# Patient Record
Sex: Female | Born: 1965 | Race: White | Hispanic: No | Smoking: Never smoker
Health system: Southern US, Community
[De-identification: ages and names within clinical notes are randomized; demographics above are authoritative.]

## PROBLEM LIST (undated history)

## (undated) DIAGNOSIS — F329 Major depressive disorder, single episode, unspecified: Secondary | ICD-10-CM

## (undated) DIAGNOSIS — F419 Anxiety disorder, unspecified: Secondary | ICD-10-CM

## (undated) DIAGNOSIS — F32A Depression, unspecified: Secondary | ICD-10-CM

## (undated) DIAGNOSIS — M1990 Unspecified osteoarthritis, unspecified site: Secondary | ICD-10-CM

## (undated) HISTORY — DX: Major depressive disorder, single episode, unspecified: F32.9

## (undated) HISTORY — PX: BREAST BIOPSY: SHX20

## (undated) HISTORY — PX: CHOLECYSTECTOMY: SHX55

## (undated) HISTORY — PX: OTHER SURGICAL HISTORY: SHX169

## (undated) HISTORY — PX: BREAST EXCISIONAL BIOPSY: SUR124

## (undated) HISTORY — DX: Depression, unspecified: F32.A

## (undated) HISTORY — DX: Unspecified osteoarthritis, unspecified site: M19.90

## (undated) HISTORY — DX: Anxiety disorder, unspecified: F41.9

---

## 1999-12-17 ENCOUNTER — Other Ambulatory Visit: Admission: RE | Admit: 1999-12-17 | Discharge: 1999-12-17 | Payer: Self-pay | Admitting: Obstetrics and Gynecology

## 2000-06-05 ENCOUNTER — Observation Stay (HOSPITAL_COMMUNITY): Admission: RE | Admit: 2000-06-05 | Discharge: 2000-06-06 | Payer: Self-pay | Admitting: General Surgery

## 2000-06-05 ENCOUNTER — Encounter (INDEPENDENT_AMBULATORY_CARE_PROVIDER_SITE_OTHER): Payer: Self-pay | Admitting: Specialist

## 2001-09-20 ENCOUNTER — Other Ambulatory Visit: Admission: RE | Admit: 2001-09-20 | Discharge: 2001-09-20 | Payer: Self-pay | Admitting: Obstetrics and Gynecology

## 2002-02-11 ENCOUNTER — Inpatient Hospital Stay (HOSPITAL_COMMUNITY): Admission: AD | Admit: 2002-02-11 | Discharge: 2002-02-11 | Payer: Self-pay | Admitting: Obstetrics and Gynecology

## 2002-02-12 ENCOUNTER — Observation Stay (HOSPITAL_COMMUNITY): Admission: AD | Admit: 2002-02-12 | Discharge: 2002-02-13 | Payer: Self-pay | Admitting: Obstetrics and Gynecology

## 2002-03-20 ENCOUNTER — Inpatient Hospital Stay (HOSPITAL_COMMUNITY): Admission: AD | Admit: 2002-03-20 | Discharge: 2002-03-20 | Payer: Self-pay | Admitting: *Deleted

## 2002-03-30 ENCOUNTER — Inpatient Hospital Stay (HOSPITAL_COMMUNITY): Admission: AD | Admit: 2002-03-30 | Discharge: 2002-03-30 | Payer: Self-pay | Admitting: Obstetrics and Gynecology

## 2002-04-02 ENCOUNTER — Inpatient Hospital Stay (HOSPITAL_COMMUNITY): Admission: AD | Admit: 2002-04-02 | Discharge: 2002-04-06 | Payer: Self-pay | Admitting: Obstetrics and Gynecology

## 2002-04-07 ENCOUNTER — Encounter: Admission: RE | Admit: 2002-04-07 | Discharge: 2002-05-07 | Payer: Self-pay | Admitting: Obstetrics and Gynecology

## 2002-05-11 ENCOUNTER — Other Ambulatory Visit: Admission: RE | Admit: 2002-05-11 | Discharge: 2002-05-11 | Payer: Self-pay | Admitting: Obstetrics and Gynecology

## 2003-05-22 ENCOUNTER — Other Ambulatory Visit: Admission: RE | Admit: 2003-05-22 | Discharge: 2003-05-22 | Payer: Self-pay | Admitting: Obstetrics and Gynecology

## 2004-05-22 ENCOUNTER — Other Ambulatory Visit: Admission: RE | Admit: 2004-05-22 | Discharge: 2004-05-22 | Payer: Self-pay | Admitting: Obstetrics and Gynecology

## 2004-08-01 ENCOUNTER — Inpatient Hospital Stay (HOSPITAL_COMMUNITY): Admission: AD | Admit: 2004-08-01 | Discharge: 2004-08-01 | Payer: Self-pay | Admitting: Obstetrics and Gynecology

## 2004-11-24 ENCOUNTER — Inpatient Hospital Stay (HOSPITAL_COMMUNITY): Admission: AD | Admit: 2004-11-24 | Discharge: 2004-11-24 | Payer: Self-pay | Admitting: Obstetrics and Gynecology

## 2004-12-03 ENCOUNTER — Inpatient Hospital Stay (HOSPITAL_COMMUNITY): Admission: RE | Admit: 2004-12-03 | Discharge: 2004-12-06 | Payer: Self-pay | Admitting: Obstetrics and Gynecology

## 2005-01-15 ENCOUNTER — Other Ambulatory Visit: Admission: RE | Admit: 2005-01-15 | Discharge: 2005-01-15 | Payer: Self-pay | Admitting: Obstetrics and Gynecology

## 2006-06-09 ENCOUNTER — Encounter: Admission: RE | Admit: 2006-06-09 | Discharge: 2006-06-09 | Payer: Self-pay | Admitting: Obstetrics and Gynecology

## 2006-09-17 ENCOUNTER — Encounter: Admission: RE | Admit: 2006-09-17 | Discharge: 2006-09-17 | Payer: Self-pay | Admitting: Interventional Radiology

## 2007-04-06 ENCOUNTER — Encounter: Admission: RE | Admit: 2007-04-06 | Discharge: 2007-04-06 | Payer: Self-pay | Admitting: Interventional Radiology

## 2007-04-08 ENCOUNTER — Encounter: Admission: RE | Admit: 2007-04-08 | Discharge: 2007-04-08 | Payer: Self-pay | Admitting: Interventional Radiology

## 2007-04-13 ENCOUNTER — Encounter: Admission: RE | Admit: 2007-04-13 | Discharge: 2007-04-13 | Payer: Self-pay | Admitting: Interventional Radiology

## 2007-04-28 ENCOUNTER — Encounter: Admission: RE | Admit: 2007-04-28 | Discharge: 2007-04-28 | Payer: Self-pay | Admitting: Interventional Radiology

## 2007-05-04 ENCOUNTER — Encounter: Admission: RE | Admit: 2007-05-04 | Discharge: 2007-05-04 | Payer: Self-pay | Admitting: Interventional Radiology

## 2007-10-27 ENCOUNTER — Encounter: Admission: RE | Admit: 2007-10-27 | Discharge: 2007-10-27 | Payer: Self-pay | Admitting: Interventional Radiology

## 2011-04-25 NOTE — Op Note (Signed)
[facility]  Patient:    [name], [name]                           MRN: [id number] Adm. Date:  [id number] Disc. Date: [id number] Attending:  Cherylynn Ridges                           Operative Report  PREOPERATIVE DIAGNOSIS:  Biliary dyskinesia with symptoms.  POSTOPERATIVE DIAGNOSIS:  Biliary dyskinesia with symptoms.  PROCEDURE:  Laparoscopic cholecystectomy.  SURGEON:  Jimmye Norman, M.D.  ASSISTANT:  Zigmund Daniel, M.D.  ANESTHESIA:  General endotracheal.  ESTIMATED BLOOD LOSS:  Less than 20 cc.  COMPLICATIONS:  None.  CONDITION:  Stable.  INDICATIONS FOR OPERATION:  The patient is a 45 -year-old female with abdominal pain, epigastric, attacks almost every 2-3 months.  Biliary dyskinesia noted on a HIDA scan who had pain with cholecystokinin injection. She now comes in for an elective laparoscopic cholecystectomy.  FINDINGS:  The patient had a normal-looking gallbladder, no evidence of stones, no acute inflammation.  OPERATION:  The patient was taken to the operating room and placed on the table in the supine position.  After adequate general anesthetic was administered she was prepped and draped in the usual sterile manner and disposed in the midline and the right upper quadrant of the abdomen.  A #11 blade was used to make a supraumbilical curvilinear incision down to the midline fascia through which a Veress needle was passed into the peritoneal cavity.  Because of the malfunction of the insufflation tubing, which had a plug in the filter port, it could not verify, although the saline test was done for the Veress needle, there was no carbon dioxide insufflation and it turned out there was a clog in the tubing itself.  We had to pass the Veress needle again, get a new tubing and subsequently inflated the abdomen up to a maximum intra-abdominal pressure of 15 mmHg.  A 10 11 mm trocar and cannula were then passed through the supraumbilical  fascia into the peritoneal cavity and it was confirmed to be in the peritoneal cavity using the laparoscope with attached camera and light source.  Once this was done, two right costal margin 5-mm cannulas and a subxiphoid 10 11 mm cannula were passed into the peritoneal cavity under direct vision.  Once they were all in place we retracted the gallbladder into the right upper quadrant, placed _____ reverse Trendelenburg and then the dissection was begun.  With the gallbladder retractor, the infundibulum had a _____ grasper on it. We were able to fan out the triangle of Calot and the hepatobiliary triangle. The peritoneum was incised using electrocautery.  We subsequently used blunt dissection to dissect out the cystic duct which was very small and seemed to have only 1 mm or so of lumen and the cystic artery.  Both were endoclipped proximally and distally and then subsequently transected.  The gallbladder was dissected out of its bed using electrocautery on spatula with minimal difficulty.  We then brought it out through the supraumbilical fascia with minimal difficulty although there was some spillage from the gallbladder wall.  We irrigated this out with 2 L of saline.  Subsequently the patient was placed flat, her supraumbilical fascia was reapproximated using a figure-of-eight suture of 0 Vicryl.  The skin was closed using a running, subcuticular stitch of 4-0 Vicryl at all sites.  Then  0.25% Marcaine with epinephrine was injected at all incisions.  All needle counts, sponge counts, and instrument counts were correct at the completion of the case.   Sterile dressings were applied. DD:  06/05/00 TD:  06/06/00 Job: [id number]

## 2011-04-25 NOTE — Op Note (Signed)
[facility]  Patient:    [name], [name] Visit Number: [id number] MRN: [id number]          Service Type: OBS Location: 910A 9146 01 Attending Physician:  Frederich Balding Dictated by:   Juluis Mire, M.D. Proc. Date: 04/03/02 Admit Date:  04/02/2002                             Operative Report  PREOPERATIVE DIAGNOSIS:       Intrauterine pregnancy at term with failure to                               progress.  POSTOPERATIVE DIAGNOSIS:      Intrauterine pregnancy at term with failure to                               progress.  OPERATION:                    Low transverse cesarean section.  SURGEON:                      Juluis Mire, M.D.  ANESTHESIA:                   Epidural.  ESTIMATED BLOOD LOSS:         800 cc.  PACKS AND DRAINS:             None.  INTRAOPERATIVE BLOOD REPLACEMENT:                  None.  COMPLICATIONS:                None.  INDICATIONS:                  The patient is a 45 year old primigravida married white female who presented at term with spontaneous rupture of membranes.  She was begun on Pitocin augmentation.  Progressed to approximately 3 cm of dilatation and then had complete arrest of dilatation for over four to five hours.  During this time, adequate uterine cavity was documented by an intrauterine pressure catheter.  Fetal heart rate was reassuring at this point and reactive.  There had been an episode after the epidural that the baby began having decelerations, but this responded to hydration and ephedrine.  It was thought to be secondary to blood pressure drop.  We felt that the infant was not that large, but we felt that potentially it was a cervical dystocia due to the early rupture of membranes with an unfavorable cervix.  Of note, due to prolonged rupture of membranes, she had been placed on penicillin G.  We discussed the risks of cesarean section including the risk of infection, the risk of  hemorrhage, the risk of injury to adjacent organs including bladder, bowel, and ureters, the risks of deep venous thrombosis and pulmonary embolus.  The patient expressed understanding the indications and risks.  DESCRIPTION OF PROCEDURE:     The patient was taken to the OR and placed in the supine position with a left lateral tilt.  After a satisfactory level of epidural anesthesia was obtained, the abdomen was prepped out with Betadine, and draped in the sterile field.  A low transverse skin incision was made with the knife and  carried through the subcutaneous tissue.  The fascia was entered sharply and the incision in the fascia extended laterally.  The fascia was taken off the muscle superiorly and inferiorly.  The rectus muscles were separated in the midline.  The peritoneum was entered sharply and the incision in the peritoneum was extended both superiorly and inferiorly.  A low transverse bladder flap was developed.  A low transverse uterine incision was begun with a knife and extended laterally using manual traction.  The infant then presented in the vertex presentation and delivered with elevation of head and fundal pressure.  The infant was a viable female who weighed 7 pounds and 3 ounces.  Apgars were 8/9.  Umbilical cord pH was 7.30.  The placenta was then delivered manually.  The uterus was wiped free of remaining membranes and placenta.  The uterus was closed with an interlocking suture of 0 chromic using a two layer closure technique.  Good hemostasis was noted.  Urine output remained clear and adequate.  The tubes and ovaries were visualized and noted to be unremarkable.  Muscles and peritoneum were closed with a running suture of 3-0 Vicryl.  The fascia was closed with a running suture of 0 PDS.  The skin was closed with staples and Steri-Strips.  Sponge, instrument, and needle count was reported as correct by the circulating nurse x2.  Foley catheter remained clear at the  time of closure. The patient tolerated the procedure well and was returned to the recovery room in good condition. Dictated by:   Juluis Mire, M.D. Attending Physician:  Frederich Balding DD:  04/03/02 TD:  04/04/02 Job: [id number]

## 2011-04-25 NOTE — H&P (Signed)
[facility]  Patient:    [name], [name]                            MRN: [id number] Attending:  Jimmye Norman, M.D. CC:         Everardo All. Madilyn Fireman, M.D.                         History and Physical  DATE OF BIRTH:  07/02/1966.  IDENTIFICATION AND CHIEF COMPLAINT:  The patient is a 45 year old with gallbladder dyskinesia and colic secondary to injection of cholecystokinin, who comes in now for a laparoscopic cholecystectomy.  She comes in with a two-year-or-plus history of epigastric and upper abdominal and back pain, thought previously to be from gastroesophageal reflux and was treated with Tagamet.  Although she did have some improvement, she continued to have some epigastric pain and now she has developed some nausea but no vomiting.  She had a HIDA scan which demonstrated biliary dyskinesia with an ejection fraction of only 16% and she developed nausea with the injection of the cholecystokinin.  Her previous ultrasound failed to show stones.  She has been seen by Dr. Everardo All. Madilyn Fireman, who has evaluated her for GERD and has her on appropriate medication.  She is otherwise healthy and no previous medical history.  MEDICATIONS:  She takes only Ortho-Novum 7/7/7 for birth control pills.  ALLERGIES:  No known drug allergies.  PAST SURGICAL HISTORY:  She had a previous breast biopsy in 1992.  PHYSICAL EXAMINATION:  GENERAL:  She is well-developed, well-nourished, afebrile.  HEENT:  She is anicteric.  NECK:  Supple.  CHEST:  Clear to auscultation and percussion.  CARDIAC:  Regular rhythm and rate with no murmurs, gallops, rubs or heaves.  ABDOMEN:  Soft with some mild epigastric tenderness; this was on January 16th.  PELVIC:  Deferred.  RECTAL:  Deferred.  IMPRESSION:  Symptomatic gallbladder dyskinesia and we have gone through extensive discussion about the possibility that with the gallbladder surgery, her pain may not improve, but considering  that a lot of her symptoms tend to fall after she has eaten a fatty meal, there is a good change she will have considerable improvement from this procedure.  After discussing with the patient and her husband and Dr. Madilyn Fireman, he decided to go ahead with a laparoscopic cholecystectomy, and no cholangiogram is planned at this time, and the patient understands the risks and benefits of the procedure and wishes to proceed. DD:  06/05/00 TD:  06/05/00 Job: [id number]

## 2011-04-25 NOTE — Discharge Summary (Signed)
[name], [name]                  ACCOUNT NO.:  [id number]   MEDICAL RECORD NO.:  [id number]          PATIENT TYPE:  INP   LOCATION:  9118                          FACILITY:  [facility]   PHYSICIAN:  Guy Sandifer. Henderson Cloud, M.D. DATE OF BIRTH:  08-Sep-1966   DATE OF ADMISSION:  12/03/2004  DATE OF DISCHARGE:  12/06/2004                                 DISCHARGE SUMMARY   ADMISSION DIAGNOSES:  1.  Intrauterine pregnancy at 4 weeks' estimated gestational age.  2.  Previous cesarean with desired repeat.   DISCHARGE DIAGNOSES:  1.  Status post low transverse cesarean section.  2.  Viable female infant.   PROCEDURE:  Repeat low transverse cesarean section.   REASON FOR ADMISSION:  Please see dictated H&P.   HOSPITAL COURSE:  The patient is a 45 year old, G3, P1 that was admitted at  [facility] for a scheduled cesarean section at 48 weeks'  estimated gestational age.  The patient had a previous cesarean and desired  repeat.  On the morning of admission, the patient was taken to the operating  room where spinal anesthesia was administered without difficulty.  A low  transverse incision was made with delivery of a viable female infant  weighing 8 pounds 2 ounces, Apgar's with 8 and 9 at 1 and 5 minutes.  Arterial cord pH was 7.30.  The patient tolerated the procedure well and was  taken to the recovery room in stable condition.   On postop day #1, the patient was without complaint.  Vital signs were  stable.  Abdomen was soft.  Fundus was firm and nontender.  Abdominal  dressing was noted to be clean, dry and intact.  Labs revealed hemoglobin of  9.8, platelet count of 214,000, WBC count of 10.7.   On postop day #2, the patient was without complaint.  Vital signs remained  stable.  Blood pressure was 119/77.  Abdominal dressing had been removed  revealing incision that was clean, dry and intact.  The patient was  ambulating well and tolerating a regular diet without complaints of  nausea  and vomiting.   On postop day #3, the patient was without complaint.  Vital signs remained  stable.  Abdomen was soft.  Fundus was firm and nontender.  Incision was  noted with a slight amount of oozing noted at the right margin of the  incision.  Staples remained intact.  The patient was discharged home.   CONDITION ON DISCHARGE:  Stable.   DIET:  Regular as tolerated.   ACTIVITY:  No heavy lifting.  No driving x2 weeks.  No vaginal entry.   FOLLOW UP:  The patient is to follow up in the office in 4-5 days for staple  removal.  She is to call for a temperature greater than 100 degrees,  persistent nausea and vomiting, heavy vaginal bleeding and any redness or  drainage from the incisional site.   DISCHARGE MEDICATIONS:  1.  Zoloft 100 mg one p.o. daily.  2.  Darvocet-N 100, #20, one p.o. every 4-6h. p.r.n.  3.  Motrin 600 mg every 6  hours p.r.n.  4.  Prenatal vitamins one p.o. daily.  5.  Colace one p.o. daily p.r.n.      CC/MEDQ  D:  12/23/2004  T:  12/23/2004  Job:  [id number]

## 2011-04-25 NOTE — Op Note (Signed)
[name], [name]                  ACCOUNT NO.:  [id number]   MEDICAL RECORD NO.:  [id number]          PATIENT TYPE:  INP   LOCATION:  NA                            FACILITY:  [facility]   PHYSICIAN:  Dineen Kid. Rana Snare, M.D.    DATE OF BIRTH:  03-Apr-1966   DATE OF PROCEDURE:  12/03/2004  DATE OF DISCHARGE:                                 OPERATIVE REPORT   PREOPERATIVE DIAGNOSES:  1.  Intrauterine pregnancy at 39 weeks.  2.  Previous cesarean section with desired repeat.   POSTOPERATIVE DIAGNOSES:  1.  Intrauterine pregnancy at 39 weeks.  2.  Previous cesarean section with desired repeat.   PROCEDURE:  Repeat low segment transverse cesarean section.   SURGEON:  Dineen Kid. Rana Snare, M.D.   ANESTHESIA:  Spinal.   INDICATIONS:  [name] is a 45 year old G3, P1, A1, at 39 weeks, previous  cesarean section, desired repeat.  Presents for that today.  Her pregnancy  has been complicated by advanced maternal age with normal AFP and  ultrasound.  Some depressive symptoms, currently on Zoloft, is doing well.  Her EDC is December 11, 2004.  Risks and benefits were discussed.  Informed  consent was obtained.   FINDINGS AT THE TIME OF SURGERY:  A viable female infant, Apgars were 8 and  9.  The pH arterial is currently pending.  Weight is 8 pounds 2 ounces.   DESCRIPTION OF PROCEDURE:  After adequate analgesia, the patient is placed  in the supine position with a left lateral tilt.  She is sterilely prepped  and draped, the bladder is sterilely drained with a Foley catheter.  A  Pfannenstiel skin incision was made two fingerbreadths above the pubic  symphysis, taken down sharply to the fascia, which was incised transversely,  extended superiorly and inferiorly off the bellies of the rectus muscle,  separated sharply in the midline.  The peritoneum was entered sharply.  The  bladder flap was created and placed behind the bladder blade.  A low segment  myotomy incision was made down to the infant's vertex,  extended laterally  with the operator's fingertips.  The infant's vertex had just been  delivered.  Nares and pharynx suctioned.  Nuchal cord x2 was reduced.  The  infant was then delivered, cord clamped and cut, and handed to the  pediatricians for resuscitation.  Cord blood was obtained, the placenta was  extracted manually.  The uterus was exteriorized, wiped clean with a dry  lap.  The myotomy incision was closed in two layers, the first being a  running locking layer, the second being an imbricating layer of 0 Monocryl  suture.  The uterus was placed back in the peritoneal cavity.  After a  copious amount of irrigation, adequate hemostasis was assured.  The  peritoneum was closed with 0 Monocryl, the rectus muscle plicated in the  midline.  After adequate hemostasis was assured, the fascia was then closed  with 0 PDS in a running fashion.  Irrigation was applied and after adequate  hemostasis, the skin was stapled and Steri-Strips applied.  The  patient tolerated the procedure well and was stable on transfer to the  recovery room.  The sponge, needle and instrument count was normal x3.  Estimated blood loss 800 mL.  The patient received 1 g of Rocephin after  delivery of the placenta.     Davi   DCL/MEDQ  D:  12/03/2004  T:  12/03/2004  Job:  [id number]

## 2011-04-25 NOTE — Discharge Summary (Signed)
[facility]  Patient:    [name], [name] Visit Number: [id number] MRN: [id number]          Service Type: OBS Location: 910A 9146 01 Attending Physician:  Frederich Balding Dictated by:   Julio Sicks, N.P. Admit Date:  04/02/2002 Discharge Date: 04/06/2002                             Discharge Summary  ADMISSION DIAGNOSES:          1. Intrauterine pregnancy at term.                               2. Failure to progress.  DISCHARGE DIAGNOSES:          1. Status post cesarean section.                               2. Viable female infant.  PROCEDURE:                    Primary low transverse cesarean section.  REASON FOR ADMISSION:         Please see written H&P.  HOSPITAL COURSE:              The patient was admitted at term with spontaneous rupture of membranes.  Fluid was clear.  Fetal heart tones were reassuring.  The patient was started on Pitocin augmentation.  The patient progressed to 3 cm dilated.  She had complete arrest of dilatation x4 to 5 hours.  Intrauterine pressure catheter was placed for documentation of adequate labor.  IV antibiotics were administered prophylactically due to prolonged rupture of membranes.  After epidural administration, the baby began decellerations that did respond to IV fluids and Effedrin.  Decellerations were thought to be related to maternal blood pressure drop.  After discussion with the patient, decision was made to proceed with a chest discomfort.  The patient was prepped and taken to the operating room for a low transverse cesarean section.  A viable female infant was delivered without complications weighing 7 pounds 3 ounces.  Apgars of 8 at one minute and 9 at five minutes. Umbilical cord pH was 7.30.  The patient tolerated the procedure well and was taken to the recovery room.  By postoperative day #1, the patient had good return of bowel function.  She was tolerating a clear liquid diet  without complaints of nausea and vomiting.  Abdominal dressing was clean, dry, and intact.  The patient did complain of itching.  Erythema was noted on abdomen thought to be related to an adhesive reaction.  Lab results revealed hemoglobin of 9.3, hematocrit 27.3, and WBC 10.0.  On postoperative day #2, itching had resolved.  Fundus was firm.  Incision was clean, dry, and intact. The patient was ambulating without difficulty.  By postoperative day #3, the patient was doing well, abdomen was soft.  Staples were removed.  The patient had some erythema noted at the left thigh to sutures that were placed due to an accidental injury in a local restaurant prior to admission.  CONDITION ON DISCHARGE:       Good.  DIET:                         Regular as tolerated.  ACTIVITY:  No heavy lifting.  No driving x2 weeks.  No vaginal entry.  FOLLOW-UP:                    The patient is to follow up in a week to 10 days for an incision check.  She is to call for a temperature greater than 100 degrees, persistent nausea and vomiting, heavy vaginal bleeding, and/or redness or drainage from the incision site.  DISCHARGE MEDICATIONS:        1. Keflex 250 mg #14 one p.o. b.i.d.                               2. Darvocet-N 100 #30 one to two every four to                                  six hours p.r.n. pain.                               3. Prenatal vitamins one p.o. q.d. Dictated by:   Julio Sicks, N.P. Attending Physician:  Frederich Balding DD:  04/22/02 TD:  04/25/02 Job: [id number]

## 2012-03-02 ENCOUNTER — Other Ambulatory Visit: Payer: Self-pay | Admitting: Obstetrics and Gynecology

## 2012-03-02 DIAGNOSIS — N6310 Unspecified lump in the right breast, unspecified quadrant: Secondary | ICD-10-CM

## 2012-03-02 DIAGNOSIS — N644 Mastodynia: Secondary | ICD-10-CM

## 2012-03-05 ENCOUNTER — Other Ambulatory Visit: Payer: Self-pay | Admitting: Obstetrics and Gynecology

## 2012-03-05 ENCOUNTER — Ambulatory Visit
Admission: RE | Admit: 2012-03-05 | Discharge: 2012-03-05 | Disposition: A | Discharge status: Home / Self Care | Payer: BC Managed Care – PPO | Source: Ambulatory Visit | Attending: Obstetrics and Gynecology | Admitting: Obstetrics and Gynecology

## 2012-03-05 DIAGNOSIS — N644 Mastodynia: Secondary | ICD-10-CM

## 2012-03-05 DIAGNOSIS — N6310 Unspecified lump in the right breast, unspecified quadrant: Secondary | ICD-10-CM

## 2012-04-20 ENCOUNTER — Ambulatory Visit (INDEPENDENT_AMBULATORY_CARE_PROVIDER_SITE_OTHER): Payer: BC Managed Care – PPO | Admitting: Physician Assistant

## 2012-04-20 VITALS — BP 125/87 | HR 77 | Temp 98.1°F | Resp 16 | Ht 68.0 in | Wt 235.0 lb

## 2012-04-20 DIAGNOSIS — R300 Dysuria: Secondary | ICD-10-CM

## 2012-04-20 DIAGNOSIS — B49 Unspecified mycosis: Secondary | ICD-10-CM

## 2012-04-20 DIAGNOSIS — R351 Nocturia: Secondary | ICD-10-CM

## 2012-04-20 DIAGNOSIS — B379 Candidiasis, unspecified: Secondary | ICD-10-CM

## 2012-04-20 DIAGNOSIS — N760 Acute vaginitis: Secondary | ICD-10-CM

## 2012-04-20 DIAGNOSIS — J019 Acute sinusitis, unspecified: Secondary | ICD-10-CM

## 2012-04-20 LAB — POCT URINALYSIS DIPSTICK
Leukocytes, UA: NEGATIVE
Protein, UA: NEGATIVE
Spec Grav, UA: 1.015
Urobilinogen, UA: 0.2

## 2012-04-20 LAB — POCT UA - MICROSCOPIC ONLY
Casts, Ur, LPF, POC: NEGATIVE
Crystals, Ur, HPF, POC: NEGATIVE

## 2012-04-20 LAB — POCT WET PREP WITH KOH
Clue Cells Wet Prep HPF POC: 100
Trichomonas, UA: NEGATIVE

## 2012-04-20 MED ORDER — AMOXICILLIN 875 MG PO TABS: 875.0000 mg | ORAL_TABLET | Freq: Two times a day (BID) | ORAL | Status: DC

## 2012-04-20 MED ORDER — FLUCONAZOLE 150 MG PO TABS: 150.0000 mg | ORAL_TABLET | Freq: Once | ORAL | Status: DC

## 2012-04-20 MED ORDER — METRONIDAZOLE 500 MG PO TABS: 500.0000 mg | ORAL_TABLET | Freq: Two times a day (BID) | ORAL | Status: DC

## 2012-04-20 MED ORDER — IPRATROPIUM BROMIDE 0.06 % NA SOLN: 2.0000 | Freq: Three times a day (TID) | NASAL | Status: DC

## 2012-04-20 MED ORDER — AMOXICILLIN-POT CLAVULANATE 875-125 MG PO TABS: 1.0000 | ORAL_TABLET | Freq: Two times a day (BID) | ORAL | Status: DC

## 2012-04-20 NOTE — Progress Notes (Signed)
Patient ID: [name] MRN: [id number], DOB: 07/17/1966, 46 y.o. Date of Encounter: 04/20/2012, 10:02 AM  Primary Physician: Elvina Sidle, MD, MD  Chief Complaint:  Chief Complaint  Patient presents with  . Sinusitis    started yesterday  . Urinary Tract Infection    started lastnight    HPI: 46 y.o. year old female presents with two issues. 1) 14 day history of nasal congestion, post nasal drip, sore throat, sinus pressure, and cough. Afebrile. No chills. Nasal congestion thick and green/yellow. Sinus pressure is the worst symptom. Cough is productive secondary to post nasal drip and not associated with time of day. Ears feel full, leading to sensation of muffled hearing. Has tried OTC cold preps without success. No GI complaints. Appetite normal.  2) 1 day history of dysuria, urinary frequency, and urinary urgency. Afebrile. No chills. No nausea or vomiting. No LBP/flank pain. Mild suprapubic fullness. Last UTI years ago. No vaginal complaints. No history of STD's. Up to date with Pap smear. No STD risk factors. No currently sexually active. Recently used tampons for her last cycle. Usually develops BV/vaginal candidiasis when she uses these.  No recent antibiotics, recent travels, or sick contacts   No leg trauma, sedentary periods, h/o cancer, or tobacco use.  No past medical history on file.   Home Meds: Prior to Admission medications   Medication Sig Start Date End Date Taking? Authorizing Provider  ALPRAZolam Prudy Feeler) 0.5 MG tablet Take 0.5 mg by mouth at bedtime as needed.   Yes Historical Provider, MD  busPIRone (BUSPAR) 10 MG tablet Take 10 mg by mouth 3 (three) times daily.   Yes Historical Provider, MD  citalopram (CELEXA) 40 MG tablet Take 40 mg by mouth daily.   Yes Historical Provider, MD  etonogestrel-ethinyl estradiol (NUVARING) 0.12-0.015 MG/24HR vaginal ring Place 1 each vaginally every 28 (twenty-eight) days. Insert vaginally and leave in place for 3  consecutive weeks, then remove for 1 week.   Yes Historical Provider, MD    Allergies:  Allergies  Allergen Reactions  . Latex   . Percocet (Oxycodone-Acetaminophen)     History   Social History  . Marital Status: Divorced    Spouse Name: N/A    Number of Children: N/A  . Years of Education: N/A   Occupational History  . Not on file.   Social History Main Topics  . Smoking status: Never Smoker   . Smokeless tobacco: Not on file  . Alcohol Use: 0.0 oz/week     social  . Drug Use: Not on file  . Sexually Active: Not on file   Other Topics Concern  . Not on file   Social History Narrative  . No narrative on file     Review of Systems: Constitutional: negative for chills, fever, night sweats or weight changes Cardiovascular: negative for chest pain or palpitations Respiratory: negative for hemoptysis, wheezing, or shortness of breath Abdominal: negative for nausea, vomiting or diarrhea GU: see above Dermatological: negative for rash Neurologic: negative for headache   Physical Exam: Blood pressure 125/87, pulse 77, temperature 98.1 F (36.7 C), temperature source Oral, resp. rate 16, height 5\' 8"  (1.727 m), weight 235 lb (106.595 kg), last menstrual period 04/11/2012, SpO2 99.00%., Body mass index is 35.73 kg/(m^2). General: Well developed, well nourished, in no acute distress. Head: Normocephalic, atraumatic, eyes without discharge, sclera non-icteric, nares are congested. Bilateral auditory canals clear, TM's are without perforation, pearly grey with reflective cone of light bilaterally. Serous effusion bilaterally behind  TM's. Maxillary sinus TTP. Oral cavity moist, dentition normal. Posterior pharynx with post nasal drip and mild erythema. No peritonsillar abscess or tonsillar exudate. Neck: Supple. No thyromegaly. Full ROM. No lymphadenopathy. Lungs: Clear bilaterally to auscultation without wheezes, rales, or rhonchi. Breathing is unlabored.  Heart: RRR with S1  S2. No murmurs, rubs, or gallops appreciated. Abdomen: Soft, non-distended with normoactive bowel sounds. Mild suprapubic TTP. No hepatosplenomegaly. No rebound/guarding. No obvious abdominal masses. McBurney's, Rovsing's, Iliopsoas, and table jar all negative. Negative CVA TTP. Msk:  Strength and tone normal for age. Extremities: No clubbing or cyanosis. No edema. Neuro: Alert and oriented X 3. Moves all extremities spontaneously. CNII-XII grossly in tact. Psych:  Responds to questions appropriately with a normal affect.   Labs: Results for orders placed in visit on 04/20/12  POCT UA - MICROSCOPIC ONLY      Component Value Range   WBC, Ur, HPF, POC 0-1     RBC, urine, microscopic 0-1     Bacteria, U Microscopic 1+     Mucus, UA neg     Epithelial cells, urine per micros 0-3     Crystals, Ur, HPF, POC neg     Casts, Ur, LPF, POC neg     Yeast, UA neg    POCT URINALYSIS DIPSTICK      Component Value Range   Color, UA yellow     Clarity, UA sl cloudy     Glucose, UA neg     Bilirubin, UA neg     Ketones, UA neg     Spec Grav, UA 1.015     Blood, UA neg     pH, UA 5.5     Protein, UA neg     Urobilinogen, UA 0.2     Nitrite, UA neg     Leukocytes, UA Negative    POCT WET PREP WITH KOH      Component Value Range   Trichomonas, UA Negative     Clue Cells Wet Prep HPF POC 100%     Epithelial Wet Prep HPF POC 2-3     Yeast Wet Prep HPF POC neg     Bacteria Wet Prep HPF POC 3+     RBC Wet Prep HPF POC 2-4     WBC Wet Prep HPF POC 5-8     KOH Prep POC Positive       Urine culture and uriprobe pending  ASSESSMENT AND PLAN:  46 y.o. year old female with sinusitis, BV, and vaginal candidiasis 1. Sinusitis -Amoxicillin 875 mg 1 po bid #20 no RF -Atrovent NS 0.06% 2 sprays each nare bid prn #1 no RF -Mucinex -Tylenol/Motrin prn -Rest/fluids -RTC precautions -RTC 3-5 days if no improvement 2. Bacterial vaginosis -Flagyl 500 mg #14 1 po bid no RF, SED -Await labs 3.  Vaginal candidiasis -Diflucan 150 mg #1 1 po now RF 1 -Avoid tampons in the future   Signed, Eula Listen, PA-C 04/20/2012 10:02 AM

## 2012-04-21 LAB — GC/CHLAMYDIA PROBE AMP, URINE: Chlamydia, Swab/Urine, PCR: NEGATIVE

## 2012-04-21 LAB — URINE CULTURE

## 2012-11-02 ENCOUNTER — Ambulatory Visit (INDEPENDENT_AMBULATORY_CARE_PROVIDER_SITE_OTHER): Payer: BC Managed Care – PPO | Admitting: Family Medicine

## 2012-11-02 VITALS — BP 124/82 | HR 83 | Temp 98.0°F | Resp 18 | Ht 68.0 in | Wt 238.0 lb

## 2012-11-02 DIAGNOSIS — R21 Rash and other nonspecific skin eruption: Secondary | ICD-10-CM

## 2012-11-02 DIAGNOSIS — L821 Other seborrheic keratosis: Secondary | ICD-10-CM

## 2012-11-02 DIAGNOSIS — D235 Other benign neoplasm of skin of trunk: Secondary | ICD-10-CM

## 2012-11-02 DIAGNOSIS — L0390 Cellulitis, unspecified: Secondary | ICD-10-CM

## 2012-11-02 MED ORDER — DOXYCYCLINE HYCLATE 100 MG PO TABS: 100.0000 mg | ORAL_TABLET | Freq: Two times a day (BID) | ORAL | Status: DC

## 2012-11-02 NOTE — Patient Instructions (Signed)
Seborrheic Keratosis  Seborrheic keratosis is a common, noncancerous (benign) skin growth that can occur anywhere on the skin. It looks like "stuck-on," waxy, rough, tan, brown, or black spots on the skin. These skin growths can be flat or raised. They are often called "barnacles" because of their pasted-on appearance. Usually, these skin growths appear in adulthood, around age 44, and increase in number as you age. They may also develop during pregnancy or following estrogen therapy. Many people may only have one growth appear in their lifetime, while some people may develop many growths.  CAUSES  It is unknown what causes these skin growths, but they appear to run in families.  SYMPTOMS  Seborrheic keratosis is often located on the face, chest, shoulders, back, or other areas. These growths are:  · Usually painless, but may become irritated and itchy.  · Yellow, brown, black, or other colors.  · Slightly raised or have a flat surface.  · Sometimes rough or wart-like in texture.  · Often waxy on the surface.  · Round or oval-shaped.  · Sometimes "stuck-on" in appearance.  · Sometimes single, but there are usually many growths.  Any growth that bleeds, itches on a regular basis, becomes inflamed, or becomes irritated needs to be evaluated by a skin specialist (dermatologist).  DIAGNOSIS  Diagnosis is mainly based on the way the growths appear. In some cases, it can be difficult to tell this type of skin growth from skin cancer. A skin growth tissue sample (biopsy)  may be used to confirm the diagnosis.  TREATMENT  Most often, treatment is not needed because the skin growths are benign. If the skin growth is irritated easily by clothing or jewelry, causing it to scab or bleed, treatment may be recommended. Patients may also choose to have the growths removed because they do not like their appearance. Most commonly, these growths are treated with cryosurgery.  In cryosurgery, liquid nitrogen is applied to "freeze" the  growth. The growth usually falls off within a matter of days. A blister may form and dry into a scab that will also fall off. After the growth or scab falls off, it may leave a dark or light spot on the skin. This color may fade over time, or it may remain permanent on the skin.  HOME CARE INSTRUCTIONS  If the skin growths are treated with cryosurgery, the treated area needs to be kept clean with water and soap.  SEEK MEDICAL CARE IF:  · You have questions about these growths or other skin problems.  · You develop new symptoms, including:  · A change in the appearance of the skin growth.  · New growths.  · Any bleeding, itching, or pain in the growths.  · A skin growth that looks similar to seborrheic keratosis.  Document Released: 12/27/2010 Document Revised: 02/16/2012 Document Reviewed: 12/27/2010  [employer]® Patient Information ©2013 [employer].

## 2012-11-02 NOTE — Progress Notes (Signed)
46yo woman with lesion below umbilicus and new rashes on left leg and left lateral neck area.  Objective:  NAD Left leg:  Papular anterior lower shin rash Macular linear erythematous area left lateral neck Seborrheic keratosis mid lower abdomen  Excoriated papule right posterior shoulder.  Assessment:   1. Mild infection left leg  2. Macular rash neck, secondary to the infection on leg 3. Seborrheic keratosis  Plan:  Doxycycline 100 mg bid.

## 2012-11-24 ENCOUNTER — Encounter: Payer: Self-pay | Admitting: Family Medicine

## 2012-11-24 ENCOUNTER — Ambulatory Visit (INDEPENDENT_AMBULATORY_CARE_PROVIDER_SITE_OTHER): Payer: BC Managed Care – PPO | Admitting: Family Medicine

## 2012-11-24 DIAGNOSIS — Z23 Encounter for immunization: Secondary | ICD-10-CM

## 2012-11-24 MED ORDER — TETANUS-DIPHTH-ACELL PERTUSSIS 5-2.5-18.5 LF-MCG/0.5 IM SUSP: 0.5000 mL | Freq: Once | INTRAMUSCULAR | Status: DC

## 2012-11-24 NOTE — Progress Notes (Signed)
Needs TdaP since it's been 10 years  Immuniz and prevention topics reviewed  1. Immunization due  TDaP (BOOSTRIX) injection 0.5 mL

## 2012-12-29 ENCOUNTER — Telehealth: Payer: Self-pay

## 2012-12-29 ENCOUNTER — Ambulatory Visit (INDEPENDENT_AMBULATORY_CARE_PROVIDER_SITE_OTHER): Payer: BC Managed Care – PPO | Admitting: Family Medicine

## 2012-12-29 VITALS — BP 137/88 | HR 99 | Temp 98.4°F | Resp 16 | Ht 68.0 in | Wt 239.2 lb

## 2012-12-29 DIAGNOSIS — R21 Rash and other nonspecific skin eruption: Secondary | ICD-10-CM

## 2012-12-29 DIAGNOSIS — J40 Bronchitis, not specified as acute or chronic: Secondary | ICD-10-CM

## 2012-12-29 DIAGNOSIS — R05 Cough: Secondary | ICD-10-CM

## 2012-12-29 MED ORDER — DOXYCYCLINE HYCLATE 100 MG PO TABS: 100.0000 mg | ORAL_TABLET | Freq: Two times a day (BID) | ORAL | Status: DC

## 2012-12-29 NOTE — Patient Instructions (Addendum)
Take mucinex and nyquil if needed for cough. If not improving in next 1-2 days - can start doxycycline. The rash may be due to a virus, but if any worsening of rash or other worsening of symptoms return to the clinic or go to the nearest emergency room.  If there is itching associated with the rash - you can take benadryl over the counter, but follow up if not improved.  Cough, Adult  A cough is a reflex that helps clear your throat and airways. It can help heal the body or may be a reaction to an irritated airway. A cough may only last 2 or 3 weeks (acute) or may last more than 8 weeks (chronic).  CAUSES Acute cough:  Viral or bacterial infections. Chronic cough:  Infections.  Allergies.  Asthma.  Post-nasal drip.  Smoking.  Heartburn or acid reflux.  Some medicines.  Chronic lung problems (COPD).  Cancer. SYMPTOMS   Cough.  Fever.  Chest pain.  Increased breathing rate.  High-pitched whistling sound when breathing (wheezing).  Colored mucus that you cough up (sputum). TREATMENT   A bacterial cough may be treated with antibiotic medicine.  A viral cough must run its course and will not respond to antibiotics.  Your caregiver may recommend other treatments if you have a chronic cough. HOME CARE INSTRUCTIONS   Only take over-the-counter or prescription medicines for pain, discomfort, or fever as directed by your caregiver. Use cough suppressants only as directed by your caregiver.  Use a cold steam vaporizer or humidifier in your bedroom or home to help loosen secretions.  Sleep in a semi-upright position if your cough is worse at night.  Rest as needed.  Stop smoking if you smoke. SEEK IMMEDIATE MEDICAL CARE IF:   You have pus in your sputum.  Your cough starts to worsen.  You cannot control your cough with suppressants and are losing sleep.  You begin coughing up blood.  You have difficulty breathing.  You develop pain which is getting worse or is  uncontrolled with medicine.  You have a fever. MAKE SURE YOU:   Understand these instructions.  Will watch your condition.  Will get help right away if you are not doing well or get worse. Document Released: 05/23/2011 Document Revised: 02/16/2012 Document Reviewed: 05/23/2011 [facility] Patient Information 2013 [place], [place].

## 2012-12-29 NOTE — Telephone Encounter (Signed)
RTC

## 2012-12-29 NOTE — Telephone Encounter (Signed)
PT STATES SHE HAVE A COUGH AND WOULD LIKE [place] TO CALL IN A Z-PAK WITHOUT HER BEING SEEN PLEASE CALL [phone]   [facility] [place]

## 2012-12-29 NOTE — Progress Notes (Signed)
Subjective:    Patient ID: [name], female    DOB: 06/24/1966, 47 y.o.   MRN: [id number]  HPI [name] is a 47 y.o. female Cough for past 5-6 days. Feels like worse - settling in chest, more cough. Pnd.  Chills last night and today, but not having a fever.  Able to eat and drink ok. Rash on chest noted in office -not itching, just noticed on chest and abdomen when getting into gown. No lung disease hx, did have flu vaccine this year. Possible sick contact - dtr with sore throat - had negative strep test but throat cx pending.   Allergies: Latex tape causes irritation and rash, percocet caused rash all over.   Tx: ibuprofen, robitussin.   Review of Systems  Constitutional: Positive for chills. Negative for fever.  HENT: Positive for postnasal drip. Negative for sore throat and trouble swallowing.   Respiratory: Positive for cough. Negative for shortness of breath.        Chest sore with coughing.  Skin: Positive for rash.   And as above in HPI.     Objective:   Physical Exam  Vitals reviewed. Constitutional: She is oriented to person, place, and time. She appears well-developed and well-nourished. No distress.  HENT:  Head: Normocephalic and atraumatic.  Right Ear: Hearing, tympanic membrane, external ear and ear canal normal.  Left Ear: Hearing, tympanic membrane, external ear and ear canal normal.  Nose: Nose normal.  Mouth/Throat: Oropharynx is clear and moist. No oropharyngeal exudate.  Eyes: Conjunctivae normal and EOM are normal. Pupils are equal, round, and reactive to light.  Cardiovascular: Normal rate, regular rhythm, normal heart sounds and intact distal pulses.   No murmur heard. Pulmonary/Chest: Effort normal and breath sounds normal. No respiratory distress. She has no wheezes. She has no rhonchi.       Clear, no wheeze and normal effort.   Neurological: She is alert and oriented to person, place, and time.  Skin: Skin is warm and dry. Rash noted. No  purpura noted. Rash is maculopapular. Rash is not urticarial.     Psychiatric: She has a normal mood and affect. Her behavior is normal.      Assessment & Plan:  [name] is a 47 y.o. female 1. Cough  doxycycline (VIBRA-TABS) 100 MG tablet  2. Bronchitis  doxycycline (VIBRA-TABS) 100 MG tablet  3. Rash and nonspecific skin eruption     Cough - atleast 6 days - possibly few days more, but reassuring exam and vital signs. No wheeze or rhonchi on exam.  Deferred blood tests or xrays at ov, but rtc/er precautions reviewed. Discussed possible viral nature of cough, so can continue sx care and mucinex DM as needed (oxycodone allergy - decided against narcotic cough supresant).  If not improved in 2-3 days - can fill doxycycline (possible rxn with currnet meds and Zpak - so doxy chosen for this reason).  rtc in next 4-5 days of not improving - sooner if worse.   Rash - onset in office.  Possible viral exanthem.  Sx care, benadryl otc if itches.   Er/ rtc precautions discussed if any spread or worsening. No sore throat, and dtr's strep test was negative - less likely scarlatiniform.  Patient Instructions  Take mucinex and nyquil if needed for cough. If not improving in next 1-2 days - can start doxycycline. The rash may be due to a virus, but if any worsening of rash or other worsening of symptoms return  to the clinic or go to the nearest emergency room.  If there is itching associated with the rash - you can take benadryl over the counter, but follow up if not improved.  Cough, Adult  A cough is a reflex that helps clear your throat and airways. It can help heal the body or may be a reaction to an irritated airway. A cough may only last 2 or 3 weeks (acute) or may last more than 8 weeks (chronic).  CAUSES Acute cough:  Viral or bacterial infections. Chronic cough:  Infections.  Allergies.  Asthma.  Post-nasal drip.  Smoking.  Heartburn or acid reflux.  Some medicines.  Chronic  lung problems (COPD).  Cancer. SYMPTOMS   Cough.  Fever.  Chest pain.  Increased breathing rate.  High-pitched whistling sound when breathing (wheezing).  Colored mucus that you cough up (sputum). TREATMENT   A bacterial cough may be treated with antibiotic medicine.  A viral cough must run its course and will not respond to antibiotics.  Your caregiver may recommend other treatments if you have a chronic cough. HOME CARE INSTRUCTIONS   Only take over-the-counter or prescription medicines for pain, discomfort, or fever as directed by your caregiver. Use cough suppressants only as directed by your caregiver.  Use a cold steam vaporizer or humidifier in your bedroom or home to help loosen secretions.  Sleep in a semi-upright position if your cough is worse at night.  Rest as needed.  Stop smoking if you smoke. SEEK IMMEDIATE MEDICAL CARE IF:   You have pus in your sputum.  Your cough starts to worsen.  You cannot control your cough with suppressants and are losing sleep.  You begin coughing up blood.  You have difficulty breathing.  You develop pain which is getting worse or is uncontrolled with medicine.  You have a fever. MAKE SURE YOU:   Understand these instructions.  Will watch your condition.  Will get help right away if you are not doing well or get worse. Document Released: 05/23/2011 Document Revised: 02/16/2012 Document Reviewed: 05/23/2011 [facility] Patient Information 2013 [place], [place].   \

## 2012-12-29 NOTE — Telephone Encounter (Signed)
Needs to be seen

## 2012-12-30 NOTE — Telephone Encounter (Signed)
She came in last pm.

## 2013-02-04 ENCOUNTER — Ambulatory Visit: Payer: BC Managed Care – PPO

## 2013-02-04 ENCOUNTER — Ambulatory Visit (INDEPENDENT_AMBULATORY_CARE_PROVIDER_SITE_OTHER): Payer: BC Managed Care – PPO | Admitting: Family Medicine

## 2013-02-04 VITALS — BP 144/92 | HR 93 | Temp 98.0°F | Resp 20 | Ht 68.0 in | Wt 239.0 lb

## 2013-02-04 DIAGNOSIS — R1031 Right lower quadrant pain: Secondary | ICD-10-CM

## 2013-02-04 LAB — POCT CBC
Granulocyte percent: 64.5 % (ref 37–80)
HCT, POC: 41.7 % (ref 37.7–47.9)
Hemoglobin: 13.1 g/dL (ref 12.2–16.2)
Lymph, poc: 3.1 (ref 0.6–3.4)
MCH, POC: 26.7 pg — AB (ref 27–31.2)
MCHC: 31.4 g/dL — AB (ref 31.8–35.4)
MCV: 85.2 fL (ref 80–97)
MID (cbc): 0.5 (ref 0–0.9)
MPV: 9 fL (ref 0–99.8)
POC Granulocyte: 6.4 (ref 2–6.9)
POC LYMPH PERCENT: 30.9 %L (ref 10–50)
POC MID %: 4.6 %M (ref 0–12)
Platelet Count, POC: 275 10*3/uL (ref 142–424)
RBC: 4.9 M/uL (ref 4.04–5.48)
RDW, POC: 15.2 %
WBC: 10 10*3/uL (ref 4.6–10.2)

## 2013-02-04 LAB — POCT UA - MICROSCOPIC ONLY: Casts, Ur, LPF, POC: NEGATIVE

## 2013-02-04 LAB — POCT URINALYSIS DIPSTICK
Bilirubin, UA: NEGATIVE
Glucose, UA: NEGATIVE
Leukocytes, UA: NEGATIVE
Nitrite, UA: NEGATIVE

## 2013-02-04 NOTE — Progress Notes (Signed)
[facility]:  Office Visit  Chief Complaint:  Chief Complaint  Patient presents with  . Abdominal Pain    RLQ pain, hurt with deep breath    HPI: [name] is a 47 y.o. female who complains of  RLQ abd pain  and is sharp everytime she moves, she feels it when sitting but mostly when she moves, she still has ovaries and appendix. Pain not associated with food, drank skim milk last night but did not seem to affect her until midnight. She has had 2 BM since last night, did not feel better. She denies rash. Denies chills, fevers, dairrhea, nausea, vomiting, fevers, chills, dysuria. No prior h/o kidney stones. 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 4/10 pain. No meds for this. No new exercise activities, no increase in exertion   Past Medical History  Diagnosis Date  . Depression   . Anxiety    Past Surgical History  Procedure Laterality Date  . Cholecystectomy    . Cesarean section     History   Social History  . Marital Status: Divorced    Spouse Name: N/A    Number of Children: N/A  . Years of Education: N/A   Social History Main Topics  . Smoking status: Never Smoker   . Smokeless tobacco: None  . Alcohol Use: 0.0 oz/week     Comment: social  . Drug Use: No  . Sexually Active: Yes   Other Topics Concern  . None   Social History Narrative  . None   Family History  Problem Relation Age of Onset  . Diabetes Mother   . Rheum arthritis Mother   . Cancer Father     esophageal  . Cataracts Father   . Lupus Sister   . Allergies Son   . Anxiety disorder Son   . Cancer Maternal Grandmother     breast  . Heart attack Maternal Grandfather   . Cancer Paternal Grandmother     pancreatic  . Heart attack Paternal Grandfather    Allergies  Allergen Reactions  . Latex   . Percocet (Oxycodone-Acetaminophen)    Prior to Admission medications   Medication Sig Start Date End Date Taking? Authorizing Provider  ALPRAZolam Prudy Feeler) 0.5 MG tablet Take  0.5 mg by mouth at bedtime as needed.   Yes Historical Provider, MD  busPIRone (BUSPAR) 15 MG tablet Take 15 mg by mouth 2 (two) times daily.   Yes Historical Provider, MD  citalopram (CELEXA) 40 MG tablet Take 40 mg by mouth daily.   Yes Historical Provider, MD  etonogestrel-ethinyl estradiol (NUVARING) 0.12-0.015 MG/24HR vaginal ring Place 1 each vaginally every 28 (twenty-eight) days. Insert vaginally and leave in place for 3 consecutive weeks, then remove for 1 week.   Yes Historical Provider, MD  doxycycline (VIBRA-TABS) 100 MG tablet Take 1 tablet (100 mg total) by mouth 2 (two) times daily. 12/29/12   Shade Flood, MD  ipratropium (ATROVENT) 0.06 % nasal spray Place 2 sprays into the nose 3 (three) times daily. 04/20/12 04/20/13  Raymon Mutton Dunn, PA-C     ROS: The patient denies fevers, chills, night sweats, unintentional weight loss, chest pain, palpitations, wheezing, dyspnea on exertion, nausea, vomiting, dysuria, hematuria, melena, numbness, weakness, or tingling.   All other systems have been reviewed and were otherwise negative with the exception of those mentioned in the HPI and as above.    PHYSICAL EXAM: Filed Vitals:   02/04/13 1649  BP: 144/92  Pulse: 93  Temp: 98 F (36.7 C)  Resp: 20   Filed Vitals:   02/04/13 1649  Height: 5\' 8"  (1.727 m)  Weight: 239 lb (108.41 kg)   Body mass index is 36.35 kg/(m^2).  General: Alert, no acute distress HEENT:  Normocephalic, atraumatic, oropharynx patent.  Cardiovascular:  Regular rate and rhythm, no rubs murmurs or gallops.  No Carotid bruits, radial pulse intact. No pedal edema.  Respiratory: Clear to auscultation bilaterally.  No wheezes, rales, or rhonchi.  No cyanosis, no use of accessory musculature GI: No organomegaly, abdomen is soft and + mildly tender on palpation RLQ, positive bowel sounds.  No masses. No distension, no rebound, no guarding Skin: No rashes. Neurologic: Facial musculature symmetric. Psychiatric:  Patient is appropriate throughout our interaction. Lymphatic: No cervical lymphadenopathy Musculoskeletal: Gait intact.   LABS: Results for orders placed in visit on 02/04/13  POCT URINALYSIS DIPSTICK      Result Value Range   Color, UA yellow     Clarity, UA clear     Glucose, UA neg     Bilirubin, UA neg     Ketones, UA trace     Spec Grav, UA 1.025     Blood, UA neg     pH, UA 6.0     Protein, UA neg     Urobilinogen, UA 1.0     Nitrite, UA neg     Leukocytes, UA Negative    POCT UA - MICROSCOPIC ONLY      Result Value Range   WBC, Ur, HPF, POC 0-1     RBC, urine, microscopic 0-3     Bacteria, U Microscopic 1+     Mucus, UA trace     Epithelial cells, urine per micros 0-1     Crystals, Ur, HPF, POC neg     Casts, Ur, LPF, POC neg     Yeast, UA neg    POCT CBC      Result Value Range   WBC 10.0  4.6 - 10.2 K/uL   Lymph, poc 3.1  0.6 - 3.4   POC LYMPH PERCENT 30.9  10 - 50 %L   MID (cbc) 0.5  0 - 0.9   POC MID % 4.6  0 - 12 %M   POC Granulocyte 6.4  2 - 6.9   Granulocyte percent 64.5  37 - 80 %G   RBC 4.90  4.04 - 5.48 M/uL   Hemoglobin 13.1  12.2 - 16.2 g/dL   HCT, POC 16.1  09.6 - 47.9 %   MCV 85.2  80 - 97 fL   MCH, POC 26.7 (*) 27 - 31.2 pg   MCHC 31.4 (*) 31.8 - 35.4 g/dL   RDW, POC 04.5     Platelet Count, POC 275  142 - 424 K/uL   MPV 9.0  0 - 99.8 fL     EKG/XRAY:   Primary read interpreted by Dr. Conley Rolls at [facility]. ? Right hilar fullness vs vascular markings + stool, no pneumothorax, free air, obstruction   ASSESSMENT/PLAN: Encounter Diagnosis  Name Primary?  . RLQ abdominal pain Yes   ? Constipation vs appendicitis Rx Miralax If no improvement in 24-48 hrs then get stat Ct scan  Go to ER prn     Britten Seyfried PHUONG, DO 02/04/2013 6:19 PM

## 2013-02-05 LAB — COMPREHENSIVE METABOLIC PANEL WITH GFR
Alkaline Phosphatase: 52 U/L (ref 39–117)
Creat: 0.9 mg/dL (ref 0.50–1.10)
Glucose, Bld: 90 mg/dL (ref 70–99)
Sodium: 142 meq/L (ref 135–145)
Total Bilirubin: 0.3 mg/dL (ref 0.3–1.2)
Total Protein: 6.4 g/dL (ref 6.0–8.3)

## 2013-02-05 LAB — COMPREHENSIVE METABOLIC PANEL
ALT: 10 U/L (ref 0–35)
AST: 10 U/L (ref 0–37)
Albumin: 3.7 g/dL (ref 3.5–5.2)
BUN: 12 mg/dL (ref 6–23)
CO2: 27 mEq/L (ref 19–32)
Calcium: 8.9 mg/dL (ref 8.4–10.5)
Chloride: 106 mEq/L (ref 96–112)
Potassium: 3.9 mEq/L (ref 3.5–5.3)

## 2013-02-06 ENCOUNTER — Telehealth: Payer: Self-pay | Admitting: *Deleted

## 2013-02-06 NOTE — Telephone Encounter (Signed)
Called patient to see status of abdominal pain.  Patient stated that she felt better and the pain was better, but she hasn't produced a bowel movement yet.  Patient stated that she would continue using the miralax.

## 2013-02-15 ENCOUNTER — Telehealth: Payer: Self-pay | Admitting: Family Medicine

## 2013-02-15 ENCOUNTER — Encounter: Payer: Self-pay | Admitting: Family Medicine

## 2013-02-15 NOTE — Telephone Encounter (Signed)
Spoke with patient and gave her results and sent normal letter patient under stood

## 2013-02-17 ENCOUNTER — Telehealth: Payer: Self-pay | Admitting: Family Medicine

## 2013-02-17 NOTE — Telephone Encounter (Signed)
Faxed order for oxygen concentrator evaluation, per Dr. Elbert Ewings. Dx: COPD with Sp O2 80-92%.

## 2014-04-06 ENCOUNTER — Encounter: Payer: Self-pay | Admitting: Family Medicine

## 2014-04-06 ENCOUNTER — Ambulatory Visit (INDEPENDENT_AMBULATORY_CARE_PROVIDER_SITE_OTHER): Payer: BC Managed Care – PPO | Admitting: Family Medicine

## 2014-04-06 VITALS — BP 152/88 | HR 78 | Temp 98.5°F | Resp 18 | Ht 68.0 in | Wt 240.0 lb

## 2014-04-06 DIAGNOSIS — I10 Essential (primary) hypertension: Secondary | ICD-10-CM

## 2014-04-06 MED ORDER — LISINOPRIL-HYDROCHLOROTHIAZIDE 10-12.5 MG PO TABS: 1.0000 | ORAL_TABLET | Freq: Every day | ORAL | Status: DC

## 2014-04-06 NOTE — Progress Notes (Signed)
Subjective:  This chart was scribed for Robyn Haber, MD by Rolanda Lundborg, ED Scribe. This patient was seen in room 13 and the patient's care was started at 6:55 PM.   Patient ID: [name], female    DOB: [birth year], 48 y.o.   MRN: [id number]  Chief Complaint  Patient presents with   Hypertension    BP is running high since Feb.    HPI HPI Comments: [name] is a 48 y.o. female who presents to the [facility] complaining of HTN. Pt states her pressures have been running high for the past 2-3 months due to an increase in anxiety. Her home reading today was 160/100 (BP here 152/88). She has been adjusting psych medications for the last 2-3 months. She was initially seen by [facility] who increased the Prozac and put her on ADHD medications. She reports unwanted side effects from these including insomnia and rapid heartbeat, so she went to a different psychiatrist and was switched to celexa and clonazepam. She also switched from estrogen to progesterone birth control pills 2 weeks ago. She also reports fatigue recently.  PCP Robyn Haber, MD  Past Medical History  Diagnosis Date   Depression    Anxiety    Current Outpatient Prescriptions on File Prior to Visit  Medication Sig Dispense Refill   ALPRAZolam (XANAX) 0.5 MG tablet Take 0.5 mg by mouth at bedtime as needed.       busPIRone (BUSPAR) 15 MG tablet Take 15 mg by mouth 2 (two) times daily.       citalopram (CELEXA) 40 MG tablet Take 40 mg by mouth daily.       doxycycline (VIBRA-TABS) 100 MG tablet Take 1 tablet (100 mg total) by mouth 2 (two) times daily.  20 tablet  0   etonogestrel-ethinyl estradiol (NUVARING) 0.12-0.015 MG/24HR vaginal ring Place 1 each vaginally every 28 (twenty-eight) days. Insert vaginally and leave in place for 3 consecutive weeks, then remove for 1 week.       ipratropium (ATROVENT) 0.06 % nasal spray Place 2 sprays into the nose 3 (three) times daily.  15 mL  0    No current facility-administered medications on file prior to visit.   Allergies  Allergen Reactions   Latex    Percocet [Oxycodone-Acetaminophen]    History  Substance Use Topics   Smoking status: Never Smoker    Smokeless tobacco: Not on file   Alcohol Use: 0.0 oz/week     Comment: social     Review of Systems  Constitutional: Positive for fatigue. Negative for fever.  Gastrointestinal: Negative for vomiting.  Skin: Negative for rash.  Psychiatric/Behavioral: The patient is nervous/anxious.        Objective:   Physical Exam  Nursing note and vitals reviewed. Constitutional: She is oriented to person, place, and time. She appears well-developed and well-nourished. No distress.  HENT:  Head: Normocephalic and atraumatic.  Eyes: EOM are normal.  Cardiovascular: Normal rate.   Pulmonary/Chest: Effort normal.  Musculoskeletal: Normal range of motion.  Neurological: She is alert and oriented to person, place, and time.  Skin: Skin is warm and dry.  Psychiatric: She has a normal mood and affect. Her behavior is normal.    Filed Vitals:   04/06/14 1844  BP: 152/88  Pulse: 78  Temp: 98.5 F (36.9 C)  TempSrc: Temporal  Resp: 18  Height: 5\' 8"  (1.727 m)  Weight: 240 lb (108.863 kg)  SpO2: 98%  Wt Readings from Last 3 Encounters:  04/06/14 240 lb (108.863 kg)  02/04/13 239 lb (108.41 kg)  12/29/12 239 lb 3.2 oz (108.5 kg)        Assessment & Plan:   I personally performed the services described in this documentation, which was scribed in my presence. The recorded information has been reviewed and is accurate.  1. Hypertension    Hypertension - Plan: lisinopril-hydrochlorothiazide (PRINZIDE,ZESTORETIC) 10-12.5 MG per tablet

## 2014-04-06 NOTE — Patient Instructions (Signed)

## 2014-04-20 ENCOUNTER — Ambulatory Visit: Payer: BC Managed Care – PPO

## 2014-04-20 ENCOUNTER — Encounter: Payer: Self-pay | Admitting: Family Medicine

## 2014-04-20 ENCOUNTER — Ambulatory Visit (INDEPENDENT_AMBULATORY_CARE_PROVIDER_SITE_OTHER): Payer: BC Managed Care – PPO | Admitting: Family Medicine

## 2014-04-20 VITALS — BP 116/66 | HR 95 | Temp 98.2°F | Resp 16 | Ht 68.0 in | Wt 237.6 lb

## 2014-04-20 DIAGNOSIS — IMO0001 Reserved for inherently not codable concepts without codable children: Secondary | ICD-10-CM | POA: Insufficient documentation

## 2014-04-20 DIAGNOSIS — M791 Myalgia, unspecified site: Secondary | ICD-10-CM

## 2014-04-20 DIAGNOSIS — R079 Chest pain, unspecified: Secondary | ICD-10-CM

## 2014-04-20 DIAGNOSIS — Z0000 Encounter for general adult medical examination without abnormal findings: Secondary | ICD-10-CM

## 2014-04-20 DIAGNOSIS — E663 Overweight: Secondary | ICD-10-CM | POA: Insufficient documentation

## 2014-04-20 DIAGNOSIS — F4323 Adjustment disorder with mixed anxiety and depressed mood: Secondary | ICD-10-CM

## 2014-04-20 DIAGNOSIS — M2550 Pain in unspecified joint: Secondary | ICD-10-CM

## 2014-04-20 LAB — CBC WITH DIFFERENTIAL/PLATELET
Basophils Absolute: 0 10*3/uL (ref 0.0–0.1)
Basophils Relative: 0 % (ref 0–1)
Eosinophils Absolute: 0.1 10*3/uL (ref 0.0–0.7)
Eosinophils Relative: 1 % (ref 0–5)
HCT: 42.5 % (ref 36.0–46.0)
Hemoglobin: 14.7 g/dL (ref 12.0–15.0)
Lymphocytes Relative: 31 % (ref 12–46)
Lymphs Abs: 2.4 10*3/uL (ref 0.7–4.0)
MCH: 28.1 pg (ref 26.0–34.0)
MCHC: 34.6 g/dL (ref 30.0–36.0)
MCV: 81.3 fL (ref 78.0–100.0)
Monocytes Absolute: 0.5 10*3/uL (ref 0.1–1.0)
Monocytes Relative: 7 % (ref 3–12)
Neutro Abs: 4.6 10*3/uL (ref 1.7–7.7)
Neutrophils Relative %: 61 % (ref 43–77)
Platelets: 266 10*3/uL (ref 150–400)
RBC: 5.23 MIL/uL — ABNORMAL HIGH (ref 3.87–5.11)
RDW: 13.6 % (ref 11.5–15.5)
WBC: 7.6 10*3/uL (ref 4.0–10.5)

## 2014-04-20 LAB — COMPREHENSIVE METABOLIC PANEL
ALT: 27 U/L (ref 0–35)
AST: 20 U/L (ref 0–37)
Albumin: 4.1 g/dL (ref 3.5–5.2)
Alkaline Phosphatase: 57 U/L (ref 39–117)
BUN: 13 mg/dL (ref 6–23)
CO2: 25 mEq/L (ref 19–32)
Calcium: 8.9 mg/dL (ref 8.4–10.5)
Chloride: 103 mEq/L (ref 96–112)
Creat: 0.86 mg/dL (ref 0.50–1.10)
Glucose, Bld: 93 mg/dL (ref 70–99)
Potassium: 3.9 mEq/L (ref 3.5–5.3)
Sodium: 139 mEq/L (ref 135–145)
Total Bilirubin: 0.6 mg/dL (ref 0.2–1.2)
Total Protein: 6.6 g/dL (ref 6.0–8.3)

## 2014-04-20 LAB — POCT URINALYSIS DIPSTICK
Bilirubin, UA: NEGATIVE
Glucose, UA: NEGATIVE
Ketones, UA: NEGATIVE
Leukocytes, UA: NEGATIVE
Nitrite, UA: NEGATIVE
Protein, UA: NEGATIVE
Spec Grav, UA: >=1.03
Urobilinogen, UA: 0.2
pH, UA: 5

## 2014-04-20 LAB — RHEUMATOID FACTOR: Rhuematoid fact SerPl-aCnc: <10 IU/mL (ref <=14)

## 2014-04-20 LAB — LIPID PANEL
Cholesterol: 165 mg/dL (ref 0–200)
HDL: 48 mg/dL (ref >39)
LDL Cholesterol: 99 mg/dL (ref 0–99)
Total CHOL/HDL Ratio: 3.4 Ratio
Triglycerides: 91 mg/dL (ref <150)
VLDL: 18 mg/dL (ref 0–40)

## 2014-04-20 LAB — POCT SEDIMENTATION RATE: POCT SED RATE: 14 mm/hr (ref 0–22)

## 2014-04-20 LAB — TSH: TSH: 2.332 u[IU]/mL (ref 0.350–4.500)

## 2014-04-20 NOTE — Progress Notes (Addendum)
Subjective:  This chart was scribed for Robyn Haber, MD by Einar Pheasant, ED Scribe. This patient was seen in room 25 and the patient's care was started at 8:08 AM.    Patient ID: [name], female    DOB: 1966-09-18, 48 y.o.   MRN: [id number]  Chief Complaint  Patient presents with   Annual Exam    HPI HPI Comments: [name] is a 48 y.o. female who presents to the [facility] for her annual physical exam.  Pt was last seen by me on 04/06/14 complaining of hypertension. Her home reading was 160/100, however, at the office visit from her Triage Vitals her BP was 152/88.  Today pt states that he blood pressure has come down since she was placed on the Lisinopril. Her Office BP today is 116/66.  Pt states that she recently stopped Nuvaring because it was elevating her BP. Dr. [name] switched her to the pill. Pt states that she's been on the mini pill for approximately 3 weeks. Advised the pt on the benefits of the bill and the side effects of the drug. She reports one normal period after starting the mini pill. Pt states that her and her husband have been talking about potentially having another baby, but she is concerned about the risks associated with her age. Pt denies any other pertinent medical history.  She also states that her anxiety has improved since she started taking Celexa 60 mg. She no longer cries all day. Pt states that once in a while she feels left sided chest pain, secondary to her anxiety. She describes the pain as "dull ache", that she experiences once a week and lasts for a period of 5 minutes. Also she states that she is experiencing soreness in her left arm upon waking for about 2 months.   Pt is also complaining of right knee swelling. She states that she had an ACL replacement surgery in 2011. Pt also states that when she walks she experiences some "popping". She denies any pain, however, she states that its uncomfortable. Pt states that she is  unable to run, because she will begin to feel knee pain bilaterally. She thinks that her additional gained weight may be a contributing factor. Advised pt that weight does play a role in the discomfort, however, I recommended different type of exercises that pt could do instead of running.   [name] is also complaining of varicose veins behind her left knee. She states that she showed them to her OB/GYN who told her not to be concerned about them. Advised pt to continue wearing compression shorts. Pt agrees with the proposed course of treatment.  Pt is also complaining of Fatigue especially in the morning. She reports getting 6-7 hours of sleep at night.   Pt states that her next scheduled mammogram is in October.  There are no active problems to display for this patient.  Past Medical History  Diagnosis Date   Depression    Anxiety    Past Surgical History  Procedure Laterality Date   Cholecystectomy     Cesarean section     Acl replacement     Allergies  Allergen Reactions   Latex    Percocet [Oxycodone-Acetaminophen]    Prior to Admission medications   Medication Sig Start Date End Date Taking? Authorizing Provider  citalopram (CELEXA) 40 MG tablet Take 40 mg by mouth daily.   Yes Historical Provider, MD  clonazePAM (KLONOPIN) 1 MG tablet Take  1 mg by mouth at bedtime.   Yes Historical Provider, MD  lisinopril-hydrochlorothiazide (PRINZIDE,ZESTORETIC) 10-12.5 MG per tablet Take 1 tablet by mouth daily. 04/06/14  Yes Robyn Haber, MD  PRESCRIPTION MEDICATION Mini pill Estrogen 1 tab taking daily   Yes Historical Provider, MD  ALPRAZolam Duanne Moron) 0.5 MG tablet Take 0.5 mg by mouth at bedtime as needed.    Historical Provider, MD  busPIRone (BUSPAR) 15 MG tablet Take 15 mg by mouth 2 (two) times daily.    Historical Provider, MD  etonogestrel-ethinyl estradiol (NUVARING) 0.12-0.015 MG/24HR vaginal ring Place 1 each vaginally every 28 (twenty-eight) days. Insert vaginally  and leave in place for 3 consecutive weeks, then remove for 1 week.    Historical Provider, MD  ipratropium (ATROVENT) 0.06 % nasal spray Place 2 sprays into the nose 3 (three) times daily. 04/20/12 04/20/13  Rise Mu, PA-C   History   Social History   Marital Status: Divorced    Spouse Name: N/A    Number of Children: N/A   Years of Education: N/A   Occupational History   Not on file.   Social History Main Topics   Smoking status: Never Smoker    Smokeless tobacco: Never Used   Alcohol Use: 0.0 oz/week     Comment: social   Drug Use: No   Sexual Activity: Yes   Other Topics Concern   Not on file   Social History Narrative   No narrative on file   Review of Systems  Cardiovascular:       Hypertension  Psychiatric/Behavioral: The patient is nervous/anxious.    Objective:   Physical Exam  Nursing note and vitals reviewed. Constitutional: She is oriented to person, place, and time. She appears well-developed and well-nourished. No distress.  HENT:  Head: Normocephalic and atraumatic.  Right Ear: External ear normal.  Left Ear: External ear normal.  Mouth/Throat: Oropharynx is clear and moist. No oropharyngeal exudate.  Eyes: Conjunctivae and EOM are normal. Right eye exhibits no discharge. Left eye exhibits no discharge. No scleral icterus.  Neck: Normal range of motion. Neck supple. No JVD present. No tracheal deviation present. No thyromegaly present.  Cardiovascular: Normal rate, regular rhythm and normal heart sounds.  Exam reveals no gallop and no friction rub.   No murmur heard. Diagonal popliteal varix left knee, scattered spider veins on right medial thigh  Pulmonary/Chest: Effort normal and breath sounds normal. No stridor. No respiratory distress.  Abdominal: Soft. She exhibits no distension and no mass. There is no tenderness. There is no rebound and no guarding.  Musculoskeletal: Normal range of motion. She exhibits no edema and no tenderness.    Lymphadenopathy:    She has no cervical adenopathy.  Neurological: She is alert and oriented to person, place, and time.  Skin: Skin is warm and dry. No rash noted. She is not diaphoretic. No erythema. No pallor.  Psychiatric: She has a normal mood and affect. Her behavior is normal. Thought content normal.   Filed Vitals:   04/20/14 0757  BP: 116/66  Pulse: 95  Temp: 98.2 F (36.8 C)  TempSrc: Oral  Resp: 16  Height: 5\' 8"  (1.727 m)  Weight: 237 lb 9.6 oz (107.775 kg)  SpO2: 98%  [facility] reading (PRIMARY) by  [name]:  Streaky left lower lobe density, no definite worrisome abnormality EKG:  NSR.   Assessment & Plan:   48 year old woman with irregular periods being treated by Dr. [name]. She's currently the mini pills which may not  work for because of breast tenderness and irregular period last month.  She does have some symptoms suspicious for rheumatoid with muscle soreness in her left arm, chest pain, a sore right thumb and his right knee. She does have a family history for this so he needs to be evaluated.  Annual physical exam - Plan: CBC with Differential, Comprehensive metabolic panel, Lipid panel, TSH, POCT urinalysis dipstick, POCT SEDIMENTATION RATE, Rheumatoid factor  Arthralgia - Plan: POCT SEDIMENTATION RATE, Rheumatoid factor  Myalgia - Plan: POCT SEDIMENTATION RATE, Rheumatoid factor  Chest pain - Plan: EKG 12-Lead, EKG 12-Lead, DG Chest 2 View, DG Chest 2 View  Signed, Robyn Haber, MD    I personally performed the services described in this documentation, which was scribed in my presence. The recorded information has been reviewed and is accurate.

## 2014-04-20 NOTE — Progress Notes (Signed)
   Subjective:    Patient ID: [name], female    DOB: 1966/04/29, 48 y.o.   MRN: [id number]  HPI    Review of Systems  Constitutional: Positive for fatigue.  HENT: Negative.   Eyes: Negative.   Respiratory: Negative.   Cardiovascular: Negative.   Gastrointestinal: Negative.   Endocrine: Negative.   Genitourinary: Negative.   Musculoskeletal: Positive for arthralgias and myalgias.  Skin: Negative.   Allergic/Immunologic: Negative.   Neurological: Negative.   Psychiatric/Behavioral: The patient is nervous/anxious.        Objective:   Physical Exam        Assessment & Plan:

## 2014-04-24 ENCOUNTER — Ambulatory Visit: Payer: BC Managed Care – PPO | Admitting: Family Medicine

## 2014-06-14 ENCOUNTER — Ambulatory Visit: Payer: BC Managed Care – PPO | Admitting: Physician Assistant

## 2014-07-24 ENCOUNTER — Ambulatory Visit: Payer: BC Managed Care – PPO | Admitting: Family Medicine

## 2014-08-03 ENCOUNTER — Ambulatory Visit: Payer: BC Managed Care – PPO | Admitting: Family Medicine

## 2014-08-07 ENCOUNTER — Ambulatory Visit: Payer: BC Managed Care – PPO | Admitting: Family Medicine

## 2014-08-10 ENCOUNTER — Ambulatory Visit (INDEPENDENT_AMBULATORY_CARE_PROVIDER_SITE_OTHER): Payer: BC Managed Care – PPO | Admitting: Family Medicine

## 2014-08-10 ENCOUNTER — Encounter: Payer: Self-pay | Admitting: Family Medicine

## 2014-08-10 VITALS — BP 118/64 | HR 86 | Temp 98.6°F | Resp 16 | Ht 68.5 in | Wt 235.0 lb

## 2014-08-10 DIAGNOSIS — Z733 Stress, not elsewhere classified: Secondary | ICD-10-CM

## 2014-08-10 DIAGNOSIS — M6740 Ganglion, unspecified site: Secondary | ICD-10-CM

## 2014-08-10 DIAGNOSIS — I10 Essential (primary) hypertension: Secondary | ICD-10-CM

## 2014-08-10 DIAGNOSIS — E669 Obesity, unspecified: Secondary | ICD-10-CM

## 2014-08-10 DIAGNOSIS — L821 Other seborrheic keratosis: Secondary | ICD-10-CM

## 2014-08-10 DIAGNOSIS — I83029 Varicose veins of left lower extremity with ulcer of unspecified site: Secondary | ICD-10-CM

## 2014-08-10 DIAGNOSIS — F439 Reaction to severe stress, unspecified: Secondary | ICD-10-CM

## 2014-08-10 DIAGNOSIS — M67461 Ganglion, right knee: Secondary | ICD-10-CM

## 2014-08-10 DIAGNOSIS — I83009 Varicose veins of unspecified lower extremity with ulcer of unspecified site: Secondary | ICD-10-CM

## 2014-08-10 DIAGNOSIS — L97929 Non-pressure chronic ulcer of unspecified part of left lower leg with unspecified severity: Secondary | ICD-10-CM

## 2014-08-10 DIAGNOSIS — L97909 Non-pressure chronic ulcer of unspecified part of unspecified lower leg with unspecified severity: Secondary | ICD-10-CM

## 2014-08-10 MED ORDER — LISINOPRIL 10 MG PO TABS: 10.0000 mg | ORAL_TABLET | Freq: Every day | ORAL | Status: DC

## 2014-08-10 NOTE — Progress Notes (Signed)
Patient ID: [name] MRN: [id number], DOB: [birth year], 47 y.o. Date of Encounter: 08/10/2014, 9:44 AM  Primary Physician: Robyn Haber, MD  Chief Complaint: HTN  HPI: 48 y.o. year old female with history below presents for hypertension follow up.  She also notes right shin freckle that she has excoriated.  And the right knee nodule is still apparent below the patella.  She notes occasional popping in the right knee, but no pain She still has the varicosities in the right popliteal area.  No calf tenderness and the past edema has cleared.  She notes the blood pressure medicine causes her to void more frequently  Her job at [employer] is much better.  She has a new boss who is much less stressful.  She is working on her weight.  She doesn't exercise too much, and her diet could use some improvement.  She hasn't gained weight, but losing is still a problem.  We addressed this with trying to reduce carbs, not eat late at night, and avoiding sweet drinks.  She has seen a dietician already, and she is aware of appropriate diet because Aaron Edelman is diabetic and needing to adhere to a strict diet.  She continues to see psychiatrist for stress and celexa continues to work well with the clonazepam.  Past Medical History  Diagnosis Date  . Depression   . Anxiety      Home Meds: Prior to Admission medications   Medication Sig Start Date End Date Taking? Authorizing Provider  citalopram (CELEXA) 40 MG tablet Take 40 mg by mouth daily.   Yes Historical Provider, MD  clonazePAM (KLONOPIN) 1 MG tablet Take 1 mg by mouth at bedtime.   Yes Historical Provider, MD  PRESCRIPTION MEDICATION Mini pill Estrogen 1 tab taking daily   Yes Historical Provider, MD  lisinopril (PRINIVIL,ZESTRIL) 10 MG tablet Take 1 tablet (10 mg total) by mouth daily. 08/10/14   Robyn Haber, MD    Allergies:  Allergies  Allergen Reactions  . Latex   . Percocet [Oxycodone-Acetaminophen]     History   Social History  .  Marital Status: Divorced    Spouse Name: N/A    Number of Children: N/A  . Years of Education: N/A   Occupational History  . Manager [employer]   Social History Main Topics  . Smoking status: Never Smoker   . Smokeless tobacco: Never Used  . Alcohol Use: 0.0 oz/week     Comment: social 1 drink  . Drug Use: No  . Sexual Activity: Yes   Other Topics Concern  . Not on file   Social History Narrative   Divorced. Education: [employer]. Pt does exercise.     Family History  Problem Relation Age of Onset  . Diabetes Mother   . Rheum arthritis Mother   . Cancer Father     esophageal  . Cataracts Father   . Lupus Sister   . Allergies Son   . Anxiety disorder Son   . Cancer Maternal Grandmother     breast  . Heart attack Maternal Grandfather   . Cancer Paternal Grandmother     pancreatic  . Heart disease Paternal Grandmother   . Heart attack Paternal Grandfather     Review of Systems: Constitutional: negative for chills, fever, night sweats, weight changes, or fatigue  HEENT: negative for vision changes, hearing loss, congestion, rhinorrhea, ST, epistaxis, or sinus pressure Cardiovascular: negative for chest pain, palpitations, or DOE Respiratory: negative for hemoptysis, wheezing, shortness of  breath, or cough Abdominal: negative for abdominal pain, nausea, vomiting, diarrhea, or constipation Dermatological: negative for rash Neurologic: negative for headache, dizziness, or syncope All other systems reviewed and are otherwise negative with the exception to those above and in the HPI.   Physical Exam:  100/80 Blood pressure 118/64, pulse 86, temperature 98.6 F (37 C), temperature source Oral, resp. rate 16, height 5' 8.5" (1.74 m), weight 235 lb (106.595 kg), last menstrual period 07/31/2014, SpO2 99.00%., Body mass index is 35.21 kg/(m^2). BP Readings from Last 3 Encounters:  08/10/14 118/64  04/20/14 116/66  04/06/14 152/88   Wt Readings from Last 3  Encounters:  08/10/14 235 lb (106.595 kg)  04/20/14 237 lb 9.6 oz (107.775 kg)  04/06/14 240 lb (108.863 kg)   General: Well developed, well nourished, in no acute distress. Head: Normocephalic, atraumatic, eyes without discharge, sclera non-icteric, nares are without discharge. Bilateral auditory canals clear, TM's are without perforation, pearly grey and translucent with reflective cone of light bilaterally. Oral cavity moist, posterior pharynx without exudate, erythema, peritonsillar abscess, or post nasal drip.  Neck: Supple. No thyromegaly. Full ROM. No lymphadenopathy. No carotid bruits. Lungs: Clear bilaterally to auscultation without wheezes, rales, or rhonchi. Breathing is unlabored. Heart: RRR with S1 S2. No murmurs, rubs, or gallops appreciated.  Abdomen: Soft, non-tender, non-distended with normoactive bowel sounds. No hepatosplenomegaly. No rebound/guarding. No obvious abdominal masses. Msk:  Strength and tone normal for age. Extremities/Skin: Warm and dry. No clubbing or cyanosis. No edema. No rashes or suspicious lesions. Distal pulses 2+ and equal bilaterally. Neuro: Alert and oriented X 3. Moves all extremities spontaneously. Gait is normal. CNII-XII grossly in tact. DTR 2+, cerebellar function intact. Rhomberg normal. Psych:  Responds to questions appropriately with a normal affect.     ASSESSMENT AND PLAN:  48 y.o. year old female with hypertension, overweight issues, benign skin lesion probably an excoriated seborrheic keratosis, varicosities, probable ganglion cyst right knee with no referrable symptoms and minimal disfigurement.  Her mild facial acne is doing well with the doxycycline 50 mg daily.  She will try to hold that to see if the condition no longer needs Rx.  Follow up 3 months or with next physical.  She has a gyn visit due with Dr. [name].  Essential hypertension - Plan: lisinopril (PRINIVIL,ZESTRIL) 10 MG tablet  Seborrheic keratosis  Obesity,  unspecified  Varicose veins of lower extremities with ulcer, left  Stress  Ganglion of knee, right   Signed, Robyn Haber, MD    Signed, Robyn Haber, MD 08/10/2014 9:44 AM

## 2014-10-17 ENCOUNTER — Ambulatory Visit (INDEPENDENT_AMBULATORY_CARE_PROVIDER_SITE_OTHER): Payer: BC Managed Care – PPO | Admitting: Family Medicine

## 2014-10-17 VITALS — BP 114/78 | HR 88 | Temp 97.6°F | Resp 18 | Ht 68.0 in | Wt 214.0 lb

## 2014-10-17 DIAGNOSIS — J209 Acute bronchitis, unspecified: Secondary | ICD-10-CM

## 2014-10-17 DIAGNOSIS — H65191 Other acute nonsuppurative otitis media, right ear: Secondary | ICD-10-CM

## 2014-10-17 MED ORDER — ALBUTEROL SULFATE HFA 108 (90 BASE) MCG/ACT IN AERS: 2.0000 | INHALATION_SPRAY | Freq: Four times a day (QID) | RESPIRATORY_TRACT | Status: DC | PRN

## 2014-10-17 MED ORDER — LEVOFLOXACIN 500 MG PO TABS: 500.0000 mg | ORAL_TABLET | Freq: Every day | ORAL | Status: DC

## 2014-10-17 NOTE — Progress Notes (Signed)
° °  Subjective:    Patient ID: [name], female    DOB: 1966-05-01, 48 y.o.   MRN: [id number] This chart was scribed for Robyn Haber, MD by Zola Button, Medical Scribe. This patient was seen in Room 9 and the patient's care was started at 9:20 AM.   HPI HPI Comments: [name] is a 48 y.o. female who presents to the [facility] complaining of gradual onset URI symptoms that began 1 week ago. Patient notes first having sore throat, then developing nasal congestion, wheezing, cough, fatigue, and pressure in her ears. She states it feels like there is phlegm in her throat. Patient has tried Delsym for her symptoms. She does not normally use inhalers. Patient has allergies to Percocet.  The entire family has come down with this.  Patient is here today with her son who has similar symptoms.  Review of Systems  Constitutional: Positive for fatigue.  HENT: Positive for congestion, ear pain and sore throat.   Eyes: Negative for discharge.  Respiratory: Positive for cough and wheezing.   Cardiovascular: Negative for chest pain.  Gastrointestinal: Negative for abdominal pain and diarrhea.  Genitourinary: Negative for hematuria.  Musculoskeletal: Negative for back pain.  Skin: Negative for rash.  Neurological: Negative for seizures.  Psychiatric/Behavioral: Negative for hallucinations.       Objective:   Physical Exam CONSTITUTIONAL: Well developed/well nourished HEAD: Normocephalic/atraumatic EYES: EOM/PERRL ENMT: Mucous membranes moist NECK: supple no meningeal signs SPINE: entire spine nontender CV: S1/S2 noted, no murmurs/rubs/gallops noted LUNGS: Lungs are characterized by bilateral coarse breath sounds and a few expiratory wheezes bilaterally. There is no rales. ABDOMEN: soft, nontender, no rebound or guarding GU: no cva tenderness NEURO: Pt is awake/alert, moves all extremitiesx4 EXTREMITIES: pulses normal, full ROM SKIN: warm, color normal PSYCH: no  abnormalities of mood noted   Persistent cough during visit    Assessment & Plan:   Acute bronchitis, unspecified organism - Plan: levofloxacin (LEVAQUIN) 500 MG tablet, albuterol (PROVENTIL HFA;VENTOLIN HFA) 108 (90 BASE) MCG/ACT inhaler  Acute nonsuppurative otitis media of right ear - Plan: levofloxacin (LEVAQUIN) 500 MG tablet  Signed, Robyn Haber, MD

## 2014-10-21 ENCOUNTER — Ambulatory Visit (INDEPENDENT_AMBULATORY_CARE_PROVIDER_SITE_OTHER): Payer: BC Managed Care – PPO | Admitting: Family Medicine

## 2014-10-21 VITALS — BP 122/78 | HR 101 | Temp 97.5°F | Resp 18 | Ht 69.0 in | Wt 242.6 lb

## 2014-10-21 DIAGNOSIS — J209 Acute bronchitis, unspecified: Secondary | ICD-10-CM

## 2014-10-21 MED ORDER — PREDNISONE 20 MG PO TABS: 40.0000 mg | ORAL_TABLET | Freq: Every day | ORAL | Status: DC

## 2014-10-21 NOTE — Progress Notes (Signed)
Subjective:    Patient ID: [name], female    DOB: 1966-04-02, 48 y.o.   MRN: [id number]  HPI  Chief Complaint  Patient presents with  . Cough    Follow up  . Nasal Congestion   This chart was scribed for Robyn Haber, MD by Thea Alken, ED Scribe. This patient was seen in room 14 and the patient's care was started at 8:46 AM.  HPI Comments: [name] is a 48 y.o. female who presents to the [facility] complaining of a persistent cough with associated nasal congestion and wheezing. Pt was seen 4 days ago for similar symptoms but reports she has not gotten better. She reports trouble breathing due to nasal congestion. Pt states she is still taking Levaquin, prescribed during last visit and using an inhaler as needed.  Pt is also here with her son who presents with similar symptoms.  Past Medical History  Diagnosis Date  . Depression   . Anxiety    Allergies  Allergen Reactions  . Latex   . Percocet [Oxycodone-Acetaminophen]    Prior to Admission medications   Medication Sig Start Date End Date Taking? Authorizing Provider  albuterol (PROVENTIL HFA;VENTOLIN HFA) 108 (90 BASE) MCG/ACT inhaler Inhale 2 puffs into the lungs every 6 (six) hours as needed for wheezing or shortness of breath. 10/17/14  Yes Robyn Haber, MD  citalopram (CELEXA) 40 MG tablet Take 60 mg by mouth daily.    Yes Historical Provider, MD  clonazePAM (KLONOPIN) 1 MG tablet Take 1 mg by mouth 2 (two) times daily.    Yes Historical Provider, MD  DOXYCYCLINE HYCLATE PO Take 100 mg by mouth.   Yes Historical Provider, MD  etonogestrel-ethinyl estradiol (NUVARING) 0.12-0.015 MG/24HR vaginal ring Place 1 each vaginally every 28 (twenty-eight) days. Insert vaginally and leave in place for 3 consecutive weeks, then remove for 1 week.   Yes Historical Provider, MD  levofloxacin (LEVAQUIN) 500 MG tablet Take 1 tablet (500 mg total) by mouth daily. 10/17/14  Yes Robyn Haber, MD    lisinopril (PRINIVIL,ZESTRIL) 10 MG tablet Take 1 tablet (10 mg total) by mouth daily. 08/10/14  Yes Robyn Haber, MD   Review of Systems  HENT: Positive for congestion.   Respiratory: Positive for cough.     Objective:   Physical Exam  Constitutional: She is oriented to person, place, and time. She appears well-developed and well-nourished. No distress.  HENT:  Head: Normocephalic and atraumatic.  Eyes: Conjunctivae and EOM are normal.  Neck: Neck supple.  Cardiovascular: Normal rate.   Pulmonary/Chest: Effort normal. No respiratory distress. She has wheezes ( expiratory).  Musculoskeletal: Normal range of motion.  Neurological: She is alert and oriented to person, place, and time.  Skin: Skin is warm and dry.  Psychiatric: She has a normal mood and affect. Her behavior is normal.  Nursing note and vitals reviewed.   Filed Vitals:   10/21/14 0828  BP: 122/78  Pulse: 101  Temp: 97.5 F (36.4 C)  Resp: 18   Assessment & Plan:   1. Acute bronchitis, unspecified organism    Meds ordered this encounter  Medications  . predniSONE (DELTASONE) 20 MG tablet    Sig: Take 2 tablets (40 mg total) by mouth daily.    Dispense:  10 tablet    Refill:  0   I personally performed the services described in this documentation, which was scribed in my presence. The recorded information has been reviewed and is  accurate.  Acute bronchitis, unspecified organism - Plan: predniSONE (DELTASONE) 20 MG tablet  Signed, Robyn Haber, MD

## 2014-11-29 ENCOUNTER — Other Ambulatory Visit: Payer: Self-pay | Admitting: Obstetrics and Gynecology

## 2014-11-29 DIAGNOSIS — R928 Other abnormal and inconclusive findings on diagnostic imaging of breast: Secondary | ICD-10-CM

## 2014-12-06 ENCOUNTER — Telehealth: Payer: Self-pay

## 2014-12-06 NOTE — Telephone Encounter (Signed)
Informed pt she can try OTC solutions for head lice and to wash everything. If OTC does not help then she will come in for an OV to get a rx.

## 2014-12-06 NOTE — Telephone Encounter (Signed)
Pt states she and her family have been exposed to head lice, pt is requesting advise on what to do

## 2014-12-11 ENCOUNTER — Ambulatory Visit
Admission: RE | Admit: 2014-12-11 | Discharge: 2014-12-11 | Disposition: A | Discharge status: Home / Self Care | Payer: BC Managed Care – PPO | Source: Ambulatory Visit | Attending: Obstetrics and Gynecology | Admitting: Obstetrics and Gynecology

## 2014-12-11 DIAGNOSIS — R928 Other abnormal and inconclusive findings on diagnostic imaging of breast: Secondary | ICD-10-CM

## 2015-04-16 ENCOUNTER — Other Ambulatory Visit (HOSPITAL_COMMUNITY): Payer: Self-pay | Admitting: Interventional Radiology

## 2015-04-16 DIAGNOSIS — I8392 Asymptomatic varicose veins of left lower extremity: Secondary | ICD-10-CM

## 2015-04-25 ENCOUNTER — Ambulatory Visit
Admission: RE | Admit: 2015-04-25 | Discharge: 2015-04-25 | Disposition: A | Discharge status: Home / Self Care | Payer: BLUE CROSS/BLUE SHIELD | Source: Ambulatory Visit | Attending: Interventional Radiology | Admitting: Interventional Radiology

## 2015-04-25 ENCOUNTER — Other Ambulatory Visit (HOSPITAL_COMMUNITY): Payer: Self-pay | Admitting: Interventional Radiology

## 2015-04-25 DIAGNOSIS — I8392 Asymptomatic varicose veins of left lower extremity: Secondary | ICD-10-CM | POA: Insufficient documentation

## 2015-04-25 NOTE — Consult Note (Signed)
Chief Complaint: No chief complaint on file.   Referring Physician(s): Shick,Michael  History of Present Illness: [name] is a 49 y.o. female with bilateral lower sure me swelling and pain for several years. She complains of ankle swelling and erythema at the medial ankles bilaterally. She also complains of pain in the left popliteal fossa associated with varicosities. She describes several scattered spider veins. The pain is severe requiring ibuprofen. She continues to have pain despite ibuprofen. It does interfere with her activities of daily living. She had her right greater saphenous vein treated several years ago and had improvement, but her symptoms recurred.  Past Medical History  Diagnosis Date  . Depression   . Anxiety     Past Surgical History  Procedure Laterality Date  . Cholecystectomy    . Cesarean section    . Acl replacement      Allergies: Tape and Percocet  Medications: Prior to Admission medications   Medication Sig Start Date End Date Taking? Authorizing Provider  citalopram (CELEXA) 40 MG tablet Take 60 mg by mouth daily.    Yes Historical Provider, MD  clonazePAM (KLONOPIN) 1 MG tablet Take 1 mg by mouth 2 (two) times daily.    Yes Historical Provider, MD  DOXYCYCLINE HYCLATE PO Take 100 mg by mouth.   Yes Historical Provider, MD  etonogestrel-ethinyl estradiol (NUVARING) 0.12-0.015 MG/24HR vaginal ring Place 1 each vaginally every 28 (twenty-eight) days. Insert vaginally and leave in place for 3 consecutive weeks, then remove for 1 week.   Yes Historical Provider, MD  lisinopril (PRINIVIL,ZESTRIL) 10 MG tablet Take 1 tablet (10 mg total) by mouth daily. 08/10/14  Yes Robyn Haber, MD  albuterol (PROVENTIL HFA;VENTOLIN HFA) 108 (90 BASE) MCG/ACT inhaler Inhale 2 puffs into the lungs every 6 (six) hours as needed for wheezing or shortness of breath. Patient not taking: Reported on 04/25/2015 10/17/14   Robyn Haber, MD  levofloxacin (LEVAQUIN)  500 MG tablet Take 1 tablet (500 mg total) by mouth daily. Patient not taking: Reported on 04/25/2015 10/17/14   Robyn Haber, MD  predniSONE (DELTASONE) 20 MG tablet Take 2 tablets (40 mg total) by mouth daily. Patient not taking: Reported on 04/25/2015 10/21/14   Robyn Haber, MD     Family History  Problem Relation Age of Onset  . Diabetes Mother   . Rheum arthritis Mother   . Cancer Father     esophageal  . Cataracts Father   . Lupus Sister   . Allergies Son   . Anxiety disorder Son   . Cancer Maternal Grandmother     breast  . Heart attack Maternal Grandfather   . Cancer Paternal Grandmother     pancreatic  . Heart disease Paternal Grandmother   . Heart attack Paternal Grandfather     History   Social History  . Marital Status: Divorced    Spouse Name: N/A  . Number of Children: N/A  . Years of Education: N/A   Occupational History  . Manager [employer]   Social History Main Topics  . Smoking status: Never Smoker   . Smokeless tobacco: Never Used  . Alcohol Use: 0.0 oz/week     Comment: social 1 drink  . Drug Use: No  . Sexual Activity: Yes   Other Topics Concern  . Not on file   Social History Narrative   Divorced. Education: [employer]. Pt does exercise.     Review of Systems: A 12 point ROS discussed and  pertinent positives are indicated in the HPI above.  All other systems are negative.  Review of Systems  Vital Signs: BP 122/76 mmHg  Pulse 94  Temp(Src) 98.1 F (36.7 C)  Resp 16  SpO2 100%  Physical Exam   Imaging: US Venous Img Lower Bilateral  04/25/2015   CLINICAL DATA:  Bilateral lower extremity edema  EXAM: BILATERAL LOWER EXTREMITY VENOUS DUPLEX ULTRASOUND  TECHNIQUE: Doppler venous assessment of the bilateral lower extremity deep venous system was performed, including characterization of spectral flow, compressibility, and phasicity. Examination of the lower extremity superficial venous system was also performed.   COMPARISON:  06/09/2006  FINDINGS: There is complete compressibility of the bilateral common femoral, femoral, and popliteal veins. Doppler analysis demonstrates respiratory phasicity and augmentation of flow upon calf compression.  Examination of the right lower extremity demonstrates chronic occlusion of the right greater saphenous vein in the proximal and mid thigh. In the distal thigh, the greater saphenous vein reconstitutes. It remains patent throughout its course in the calf. It measures up to 7 mm in caliber. There is reflux lasting greater than 4 seconds. There are associated varicosities in the calf and thigh.  The right lesser saphenous vein is normal  The left greater saphenous vein is patent. There is minimal reflux in the greater saphenous vein at the saphenous femoral junction, lasting greater than 2 seconds. The vein measures up to 7 mm in caliber. It is associated with varicosities behind the left knee.  The left lesser saphenous vein is normal.  IMPRESSION: There is venous insufficiency in the right and left greater saphenous veins.  No evidence of DVT.  Lesser saphenous veins are normal.   Electronically Signed   By: Marybelle Killings M.D.   On: 04/25/2015 15:09    Labs:  CBC: No results for input(s): WBC, HGB, HCT, PLT in the last 8760 hours.  COAGS: No results for input(s): INR, APTT in the last 8760 hours.  BMP: No results for input(s): NA, K, CL, CO2, GLUCOSE, BUN, CALCIUM, CREATININE, GFRNONAA, GFRAA in the last 8760 hours.  Invalid input(s): CMP  LIVER FUNCTION TESTS: No results for input(s): BILITOT, AST, ALT, ALKPHOS, PROT, ALBUMIN in the last 8760 hours.  TUMOR MARKERS: No results for input(s): AFPTM, CEA, CA199, CHROMGRNA in the last 8760 hours.  Assessment and Plan:  There is venous insufficiency in both lower extremities involving the greater saphenous veins bilaterally. The right greater saphenous vein in the distal thigh and calf has recanalized and is now causing  symptoms. The venous insufficiency is associated with varicosities which are painful in the left popliteal fossa as well as varicosities throughout the right thigh and calf. There is swelling at the ankles and erythema which will eventually progress to ulceration without treatment. Bilateral greater saphenous vein laser mediated occlusion with sclerotherapy is recommended.  Thank you for this interesting consult.  I greatly enjoyed meeting TAUNI SANKS and look forward to participating in their care.  Signed: Octavis Sheeler, ART A 04/25/2015, 3:14 PM   I spent a total of  45 Minutes   in face to face in clinical consultation, greater than 50% of which was counseling/coordinating care for venous insufficiency

## 2015-05-25 ENCOUNTER — Ambulatory Visit (INDEPENDENT_AMBULATORY_CARE_PROVIDER_SITE_OTHER): Payer: BLUE CROSS/BLUE SHIELD | Admitting: Family Medicine

## 2015-05-25 VITALS — BP 116/74 | HR 97 | Temp 98.8°F | Resp 18 | Ht 68.5 in | Wt 248.0 lb

## 2015-05-25 DIAGNOSIS — H6500 Acute serous otitis media, unspecified ear: Secondary | ICD-10-CM | POA: Diagnosis not present

## 2015-05-25 DIAGNOSIS — Z638 Other specified problems related to primary support group: Secondary | ICD-10-CM | POA: Diagnosis not present

## 2015-05-25 DIAGNOSIS — M25561 Pain in right knee: Secondary | ICD-10-CM

## 2015-05-25 DIAGNOSIS — K219 Gastro-esophageal reflux disease without esophagitis: Secondary | ICD-10-CM

## 2015-05-25 DIAGNOSIS — F439 Reaction to severe stress, unspecified: Secondary | ICD-10-CM

## 2015-05-25 DIAGNOSIS — E669 Obesity, unspecified: Secondary | ICD-10-CM

## 2015-05-25 MED ORDER — AMOXICILLIN 875 MG PO TABS: 875.0000 mg | ORAL_TABLET | Freq: Two times a day (BID) | ORAL | Status: DC

## 2015-05-25 NOTE — Progress Notes (Signed)
° °  Subjective:    Patient ID: [name], female    DOB: July 10, 1966, 49 y.o.   MRN: [id number] This chart was scribed for Robyn Haber, MD by Zola Button, Medical Scribe. This patient was seen in Room 8 and the patient's care was started at 9:19 AM.   HPI HPI Comments: [name] is a 49 y.o. female who presents to the [facility] complaining of gradual onset, shooting, throbbing, left ear pain that started 3 days ago. Patient has tried ibuprofen and Mucinex DM. The Mucinex did provide some relief. She denies right ear pain and sinus symptoms. She states she has been going to the pool often this past week.  Acid Reflux; Patient has had some acid reflux symptoms for the past 2 months. She seems to be having reflux regardless of what she eats. The symptoms do not wake her up at night. PSHx includes cholecystectomy. Patient has been trying to lose weight. She has been going to a weekly Zumba class.  Knee Pain: Patient has also had some intermittent right knee pain. She has had a right ACL surgery. She has seen an orthopedist for this and was told that her knee was fine regarding the surgery, but was told that there was some bone-to-bone contact along the outside of her knee. Patient was also told she had some arthritis. She did receive a cortisone shot which did provide relief to the pain.  Stress: Patient's partner Aaron Edelman is currently disabled and on long-term disability. He's helping watch the children but this has been a real problem. In addition her mother has severe arthritis and COPD which requires advance planning for any family activities.   Review of Systems  HENT: Positive for ear pain. Negative for congestion, rhinorrhea and sinus pressure.   Musculoskeletal: Positive for arthralgias.       Objective:   Physical Exam CONSTITUTIONAL: Well developed/well nourished HEAD: Normocephalic/atraumatic EYES: EOM/PERRL ENMT: Mucous membranes moist. Left TM  retracted. NECK: supple no meningeal signs SPINE: entire spine nontender CV: S1/S2 noted, no murmurs/rubs/gallops noted LUNGS: Lungs are clear to auscultation bilaterally, no apparent distress ABDOMEN: soft, nontender, no rebound or guarding GU: no cva tenderness NEURO: Pt is awake/alert, moves all extremitiesx4 EXTREMITIES: pulses normal, full ROM SKIN: warm, color normal PSYCH: no abnormalities of mood noted      Assessment & Plan:   This chart was scribed in my presence and reviewed by me personally.    ICD-9-CM ICD-10-CM   1. Acute serous otitis media, recurrence not specified, unspecified laterality 381.01 H65.00 amoxicillin (AMOXIL) 875 MG tablet  2. Gastroesophageal reflux disease without esophagitis 530.81 K21.9   3. Right knee pain 719.46 M25.561   4. Obesity 278.00 E66.9   5. Stress at home V61.9 Z63.8    Reviewed her general health for 30 minutes face-to-face.  Signed, Robyn Haber, MD

## 2015-07-18 ENCOUNTER — Other Ambulatory Visit (HOSPITAL_COMMUNITY): Payer: Self-pay | Admitting: Interventional Radiology

## 2015-07-18 DIAGNOSIS — I8390 Asymptomatic varicose veins of unspecified lower extremity: Secondary | ICD-10-CM

## 2015-07-26 ENCOUNTER — Other Ambulatory Visit: Payer: BLUE CROSS/BLUE SHIELD

## 2015-08-08 ENCOUNTER — Other Ambulatory Visit: Payer: Self-pay | Admitting: Family Medicine

## 2015-08-09 ENCOUNTER — Other Ambulatory Visit: Payer: Self-pay | Admitting: Family Medicine

## 2015-08-09 DIAGNOSIS — K219 Gastro-esophageal reflux disease without esophagitis: Secondary | ICD-10-CM

## 2015-08-09 MED ORDER — OMEPRAZOLE 20 MG PO CPDR: 20.0000 mg | DELAYED_RELEASE_CAPSULE | Freq: Every day | ORAL | Status: DC | PRN

## 2015-08-14 ENCOUNTER — Ambulatory Visit
Admission: RE | Admit: 2015-08-14 | Discharge: 2015-08-14 | Disposition: A | Discharge status: Home / Self Care | Payer: BLUE CROSS/BLUE SHIELD | Source: Ambulatory Visit | Attending: Interventional Radiology | Admitting: Interventional Radiology

## 2015-08-14 DIAGNOSIS — I8390 Asymptomatic varicose veins of unspecified lower extremity: Secondary | ICD-10-CM | POA: Insufficient documentation

## 2015-08-14 NOTE — Progress Notes (Signed)
Patient ID: [name], female   DOB: [birth year], 49 y.o.   MRN: [id number]    Chief Complaint: Venous insufficiency  Referring Physician(s): Emylee Decelle  History of Present Illness: ESTEE [name] is a 49 y.o. female who returns after 3 months conservative treatment of her bilateral lower extremity venous insufficiency. She has venous insufficiency in the greater saphenous veins bilaterally associated with bilateral varicosities as well as ankle swelling. She has worn graded compression stockings which are thigh highs for 3 months. She continues to have swelling and pain in her legs. It does interfere with her activities of daily living. Pain is not relieved by ibuprofen.  Past Medical History  Diagnosis Date  . Depression   . Anxiety     Past Surgical History  Procedure Laterality Date  . Cholecystectomy    . Cesarean section    . Acl replacement      Allergies: Tape and Percocet  Medications: Prior to Admission medications   Medication Sig Start Date End Date Taking? Authorizing Provider  amoxicillin (AMOXIL) 875 MG tablet Take 1 tablet (875 mg total) by mouth 2 (two) times daily. 05/25/15   Robyn Haber, MD  citalopram (CELEXA) 40 MG tablet Take 60 mg by mouth daily.     Historical Provider, MD  clonazePAM (KLONOPIN) 1 MG tablet Take 1 mg by mouth 2 (two) times daily.     Historical Provider, MD  DOXYCYCLINE HYCLATE PO Take 100 mg by mouth.    Historical Provider, MD  etonogestrel-ethinyl estradiol (NUVARING) 0.12-0.015 MG/24HR vaginal ring Place 1 each vaginally every 28 (twenty-eight) days. Insert vaginally and leave in place for 3 consecutive weeks, then remove for 1 week.    Historical Provider, MD  lisinopril (PRINIVIL,ZESTRIL) 10 MG tablet TAKE 1 TABLET (10 MG TOTAL) BY MOUTH DAILY.  "OV NEEDED FOR REFILLS" 08/08/15   Harrison Mons, PA-C  omeprazole (PRILOSEC) 20 MG capsule Take 1 capsule (20 mg total) by mouth daily as needed. 08/09/15   Robyn Haber, MD      Family History  Problem Relation Age of Onset  . Diabetes Mother   . Rheum arthritis Mother   . Cancer Father     esophageal  . Cataracts Father   . Lupus Sister   . Allergies Son   . Anxiety disorder Son   . Cancer Maternal Grandmother     breast  . Heart attack Maternal Grandfather   . Cancer Paternal Grandmother     pancreatic  . Heart disease Paternal Grandmother   . Heart attack Paternal Grandfather     Social History   Social History  . Marital Status: Divorced    Spouse Name: N/A  . Number of Children: N/A  . Years of Education: N/A   Occupational History  . Manager [employer]   Social History Main Topics  . Smoking status: Never Smoker   . Smokeless tobacco: Never Used  . Alcohol Use: 0.0 oz/week     Comment: social 1 drink  . Drug Use: No  . Sexual Activity: Yes   Other Topics Concern  . Not on file   Social History Narrative   Divorced. Education: [employer]. Pt does exercise.      Review of Systems: A 12 point ROS discussed and pertinent positives are indicated in the HPI above.  All other systems are negative.  Review of Systems  Vital Signs: There were no vitals taken for this visit.  Physical Exam  Musculoskeletal:  There continues to be  1+ ankle edema bilaterally. There is a focal area of erythema and swelling along the medial left ankle which is worrisome for eventual progression into an ulcer. She has a similar but less impressive area along the medial right ankle. Pulses are intact distally. Lateral lower extremities are warm and pink.     Imaging: No results found.  Labs:  CBC: No results for input(s): WBC, HGB, HCT, PLT in the last 8760 hours.  COAGS: No results for input(s): INR, APTT in the last 8760 hours.  BMP: No results for input(s): NA, K, CL, CO2, GLUCOSE, BUN, CALCIUM, CREATININE, GFRNONAA, GFRAA in the last 8760 hours.  Invalid input(s): CMP  LIVER FUNCTION TESTS: No results for input(s): BILITOT,  AST, ALT, ALKPHOS, PROT, ALBUMIN in the last 8760 hours.  TUMOR MARKERS: No results for input(s): AFPTM, CEA, CA199, CHROMGRNA in the last 8760 hours.  Assessment and Plan:  Ms. Calef has failed conservative therapy for venous insufficiency and requires laser mediated occlusion of the bilateral greater saphenous veins with potential sclerotherapy. This will be scheduled for definitive treatment.    Signed: Vendela Troung, ART A 08/14/2015, 4:59 PM   I spent a total of   15 Minutes in face to face in clinical consultation, greater than 50% of which was counseling/coordinating care for venous insufficiency.

## 2015-08-20 ENCOUNTER — Other Ambulatory Visit (HOSPITAL_COMMUNITY): Payer: Self-pay | Admitting: Interventional Radiology

## 2015-08-20 DIAGNOSIS — I8390 Asymptomatic varicose veins of unspecified lower extremity: Secondary | ICD-10-CM

## 2015-09-01 ENCOUNTER — Ambulatory Visit (INDEPENDENT_AMBULATORY_CARE_PROVIDER_SITE_OTHER): Payer: BLUE CROSS/BLUE SHIELD | Admitting: Emergency Medicine

## 2015-09-01 VITALS — BP 110/78 | HR 100 | Temp 98.5°F | Resp 17 | Ht 68.25 in | Wt 245.1 lb

## 2015-09-01 DIAGNOSIS — J0140 Acute pansinusitis, unspecified: Secondary | ICD-10-CM

## 2015-09-01 DIAGNOSIS — J209 Acute bronchitis, unspecified: Secondary | ICD-10-CM | POA: Diagnosis not present

## 2015-09-01 MED ORDER — HYDROCOD POLST-CPM POLST ER 10-8 MG/5ML PO SUER: 5.0000 mL | Freq: Two times a day (BID) | ORAL | Status: DC

## 2015-09-01 MED ORDER — AMOXICILLIN-POT CLAVULANATE 875-125 MG PO TABS: 1.0000 | ORAL_TABLET | Freq: Two times a day (BID) | ORAL | Status: DC

## 2015-09-01 MED ORDER — PSEUDOEPHEDRINE-GUAIFENESIN ER 60-600 MG PO TB12: 1.0000 | ORAL_TABLET | Freq: Two times a day (BID) | ORAL | Status: DC

## 2015-09-01 NOTE — Progress Notes (Signed)
Subjective:  Patient ID: [name], female    DOB: 1966/11/06  Age: 49 y.o. MRN: [id number]  CC: Sore Throat; Cough; and Wheezing   HPI [name] presents  with nasal congestion postnasal drainage purulent collar. She has nasal congestion. She had a sore throat for a couple days but is resolved. She has a cough productive of purulent sputum. She has no fever or chills. Does have some wheezing with no shortness breath. She has no nausea vomiting or stool change. She has no improvement with over-the-counter medication.  History [name] has a past medical history of Depression and Anxiety.   She has past surgical history that includes Cholecystectomy; Cesarean section; and ACL replacement.   Her  family history includes Allergies in her son; Anxiety disorder in her son; Cancer in her father, maternal grandmother, and paternal grandmother; Cataracts in her father; Diabetes in her mother; Heart attack in her maternal grandfather and paternal grandfather; Heart disease in her paternal grandmother; Lupus in her sister; Rheum arthritis in her mother.  She   reports that she has never smoked. She has never used smokeless tobacco. She reports that she drinks alcohol. She reports that she does not use illicit drugs.  Outpatient Prescriptions Prior to Visit  Medication Sig Dispense Refill  . citalopram (CELEXA) 40 MG tablet 60 mg daily.     . clonazePAM (KLONOPIN) 1 MG tablet Take 2 mg by mouth at bedtime.     [name] Kitchen etonogestrel-ethinyl estradiol (NUVARING) 0.12-0.015 MG/24HR vaginal ring Place 1 each vaginally every 28 (twenty-eight) days. Insert vaginally and leave in place for 3 consecutive weeks, then remove for 1 week.    [name] Kitchen ibuprofen (ADVIL,MOTRIN) 400 MG tablet Take 400 mg by mouth every 6 (six) hours as needed.    [name] Kitchen lisinopril (PRINIVIL,ZESTRIL) 10 MG tablet TAKE 1 TABLET (10 MG TOTAL) BY MOUTH DAILY.  "OV NEEDED FOR REFILLS" 30 tablet 0  . omeprazole (PRILOSEC) 20 MG capsule Take 1 capsule  (20 mg total) by mouth daily as needed. 30 capsule 3  . amoxicillin (AMOXIL) 875 MG tablet Take 1 tablet (875 mg total) by mouth 2 (two) times daily. (Patient not taking: Reported on 08/14/2015) 20 tablet 0  . DOXYCYCLINE HYCLATE PO Take 100 mg by mouth.     No facility-administered medications prior to visit.    Social History   Social History  . Marital Status: Divorced    Spouse Name: N/A  . Number of Children: N/A  . Years of Education: N/A   Occupational History  . Manager [employer]   Social History Main Topics  . Smoking status: Never Smoker   . Smokeless tobacco: Never Used  . Alcohol Use: 0.0 oz/week     Comment: social 1 drink  . Drug Use: No  . Sexual Activity: Yes   Other Topics Concern  . None   Social History Narrative   Divorced. Education: [employer]. Pt does exercise.     Review of Systems  Constitutional: Negative for fever, chills and appetite change.  HENT: Positive for congestion, postnasal drip, rhinorrhea and sinus pressure. Negative for ear pain and sore throat.   Eyes: Negative for pain and redness.  Respiratory: Positive for cough. Negative for shortness of breath and wheezing.   Cardiovascular: Negative for leg swelling.  Gastrointestinal: Negative for nausea, vomiting, abdominal pain, diarrhea, constipation and blood in stool.  Endocrine: Negative for polyuria.  Genitourinary: Negative for dysuria, urgency, frequency and flank pain.  Musculoskeletal: Negative for  gait problem.  Skin: Negative for rash.  Neurological: Negative for weakness and headaches.  Psychiatric/Behavioral: Negative for confusion and decreased concentration. The patient is not nervous/anxious.     Objective:  BP 110/78 mmHg  Pulse 100  Temp(Src) 98.5 F (36.9 C) (Oral)  Resp 17  Ht 5' 8.25" (1.734 m)  Wt 245 lb 2 oz (111.188 kg)  BMI 36.98 kg/m2  SpO2 98%  Physical Exam  Constitutional: She is oriented to person, place, and time. She appears  well-developed and well-nourished. No distress.  HENT:  Head: Normocephalic and atraumatic.  Right Ear: External ear normal.  Left Ear: External ear normal.  Nose: Nose normal.  Eyes: Conjunctivae and EOM are normal. Pupils are equal, round, and reactive to light. No scleral icterus.  Neck: Normal range of motion. Neck supple. No tracheal deviation present.  Cardiovascular: Normal rate, regular rhythm and normal heart sounds.   Pulmonary/Chest: Effort normal. No respiratory distress. She has no wheezes. She has no rales.  Abdominal: She exhibits no mass. There is no tenderness. There is no rebound and no guarding.  Musculoskeletal: She exhibits no edema.  Lymphadenopathy:    She has no cervical adenopathy.  Neurological: She is alert and oriented to person, place, and time. Coordination normal.  Skin: Skin is warm and dry. No rash noted.  Psychiatric: She has a normal mood and affect. Her behavior is normal.      Assessment & Plan:   [name] was seen today for sore throat, cough and wheezing.  Diagnoses and all orders for this visit:  Acute bronchitis, unspecified organism  Acute pansinusitis, recurrence not specified  Other orders -     amoxicillin-clavulanate (AUGMENTIN) 875-125 MG per tablet; Take 1 tablet by mouth 2 (two) times daily. -     pseudoephedrine-guaifenesin (MUCINEX D) 60-600 MG per tablet; Take 1 tablet by mouth every 12 (twelve) hours. -     chlorpheniramine-HYDROcodone (TUSSIONEX PENNKINETIC ER) 10-8 MG/5ML SUER; Take 5 mLs by mouth 2 (two) times daily.  I have discontinued Ms. Piechocki's DOXYCYCLINE HYCLATE PO and amoxicillin. I am also having her start on amoxicillin-clavulanate, pseudoephedrine-guaifenesin, and chlorpheniramine-HYDROcodone. Additionally, I am having her maintain her citalopram, clonazePAM, etonogestrel-ethinyl estradiol, lisinopril, omeprazole, and ibuprofen.  Meds ordered this encounter  Medications  . amoxicillin-clavulanate (AUGMENTIN)  875-125 MG per tablet    Sig: Take 1 tablet by mouth 2 (two) times daily.    Dispense:  20 tablet    Refill:  0  . pseudoephedrine-guaifenesin (MUCINEX D) 60-600 MG per tablet    Sig: Take 1 tablet by mouth every 12 (twelve) hours.    Dispense:  18 tablet    Refill:  0  . chlorpheniramine-HYDROcodone (TUSSIONEX PENNKINETIC ER) 10-8 MG/5ML SUER    Sig: Take 5 mLs by mouth 2 (two) times daily.    Dispense:  60 mL    Refill:  0    Appropriate red flag conditions were discussed with the patient as well as actions that should be taken.  Patient expressed his understanding.  Follow-up: Return if symptoms worsen or fail to improve.  Roselee Culver, MD

## 2015-09-01 NOTE — Patient Instructions (Signed)

## 2015-09-07 ENCOUNTER — Other Ambulatory Visit: Payer: Self-pay | Admitting: Physician Assistant

## 2015-09-13 ENCOUNTER — Other Ambulatory Visit (HOSPITAL_COMMUNITY): Payer: Self-pay | Admitting: Interventional Radiology

## 2015-09-13 DIAGNOSIS — I8390 Asymptomatic varicose veins of unspecified lower extremity: Secondary | ICD-10-CM

## 2015-09-17 ENCOUNTER — Ambulatory Visit (INDEPENDENT_AMBULATORY_CARE_PROVIDER_SITE_OTHER): Payer: BLUE CROSS/BLUE SHIELD

## 2015-09-17 ENCOUNTER — Ambulatory Visit (INDEPENDENT_AMBULATORY_CARE_PROVIDER_SITE_OTHER): Payer: BLUE CROSS/BLUE SHIELD | Admitting: Emergency Medicine

## 2015-09-17 DIAGNOSIS — S161XXA Strain of muscle, fascia and tendon at neck level, initial encounter: Secondary | ICD-10-CM | POA: Diagnosis not present

## 2015-09-17 MED ORDER — CYCLOBENZAPRINE HCL 10 MG PO TABS: ORAL_TABLET | ORAL | Status: DC
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
## 2015-09-30 ENCOUNTER — Other Ambulatory Visit: Payer: Self-pay | Admitting: Emergency Medicine

## 2015-10-03 ENCOUNTER — Ambulatory Visit
Admission: RE | Admit: 2015-10-03 | Discharge: 2015-10-03 | Disposition: A | Discharge status: Home / Self Care | Payer: BLUE CROSS/BLUE SHIELD | Source: Ambulatory Visit | Attending: Interventional Radiology | Admitting: Interventional Radiology

## 2015-10-03 VITALS — BP 133/84 | HR 85 | Temp 97.6°F | Resp 14

## 2015-10-03 DIAGNOSIS — I8392 Asymptomatic varicose veins of left lower extremity: Secondary | ICD-10-CM

## 2015-10-03 DIAGNOSIS — I8390 Asymptomatic varicose veins of unspecified lower extremity: Secondary | ICD-10-CM

## 2015-10-03 MED ORDER — DIAZEPAM 10 MG PO TABS: 10.0000 mg | ORAL_TABLET | Freq: Once | ORAL | Status: DC

## 2015-10-03 NOTE — Progress Notes (Signed)
0945  Informed consent obtained.  Patient given verbal & written discharge instructions.  1660  Valium 10 mg po.  0955  22 gauge x 1" insyte catheter started IV Left antecubital (1st attempt) with Saline lock & 7" extension.  Flushed with 10 mL NSS without difficulty.  Site unremarkable.  1020  Procedure started.    1205  Procedure completed.  Wearing thigh high graduated compression garmen (20-30 mm Hg)t on LLE.  IV Left antecubital discontinued, catheter intact.  Site unremarkable.  1207  Ambulated x 10 mins without assistance.  Gait steady.  Tolerated well.    1220  Discharged to home.  [facility] here to provide transportation.  Saleen Peden Riki Rusk, RN 10/03/2015 12:27 PM

## 2015-10-04 ENCOUNTER — Other Ambulatory Visit: Payer: Self-pay | Admitting: Interventional Radiology

## 2015-10-04 ENCOUNTER — Ambulatory Visit
Admission: RE | Admit: 2015-10-04 | Discharge: 2015-10-04 | Disposition: A | Discharge status: Home / Self Care | Payer: BLUE CROSS/BLUE SHIELD | Source: Ambulatory Visit | Attending: Interventional Radiology | Admitting: Interventional Radiology

## 2015-10-04 DIAGNOSIS — I8390 Asymptomatic varicose veins of unspecified lower extremity: Secondary | ICD-10-CM
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
## 2015-10-17 ENCOUNTER — Ambulatory Visit
Admission: RE | Admit: 2015-10-17 | Discharge: 2015-10-17 | Disposition: A | Discharge status: Home / Self Care | Payer: BLUE CROSS/BLUE SHIELD | Source: Ambulatory Visit | Attending: Interventional Radiology | Admitting: Interventional Radiology

## 2015-10-17 DIAGNOSIS — I8390 Asymptomatic varicose veins of unspecified lower extremity: Secondary | ICD-10-CM
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
## 2015-11-22 ENCOUNTER — Other Ambulatory Visit (HOSPITAL_COMMUNITY): Payer: Self-pay | Admitting: Interventional Radiology

## 2015-11-22 ENCOUNTER — Ambulatory Visit
Admission: RE | Admit: 2015-11-22 | Discharge: 2015-11-22 | Disposition: A | Discharge status: Home / Self Care | Payer: BLUE CROSS/BLUE SHIELD | Source: Ambulatory Visit | Attending: Interventional Radiology | Admitting: Interventional Radiology

## 2015-11-22 DIAGNOSIS — I8390 Asymptomatic varicose veins of unspecified lower extremity: Secondary | ICD-10-CM

## 2015-11-22 NOTE — Progress Notes (Signed)
Patient ID: [name], female   DOB: Apr 26, 1966, 49 y.o.   MRN: [id number]    Chief Complaint: Patient was seen in consultation today for No chief complaint on file.  at the request of Pernell Lenoir  History of Present Illness: [name] is a 49 y.o. female who is 6 weeks post laser ablation of the left greater saphenous vein. Initially, she suffered an allergic reaction with erythema and itching along the course of the laser. This subsequently resolved and presently, she has no symptoms. She denies any fevers or chills and has been on light activities. He continues to wear her graded compression stockings and has no complaints. In addition, the previous pain behind her left knee has also resolved there is little if any tenderness along the course of the thrombosed greater saphenous vein  Past Medical History  Diagnosis Date  . Depression     Adjustment disorder with mixed anxiety and depressed mood   . Anxiety     Adjustment disorder with mixed anxiety and depressed mood     Past Surgical History  Procedure Laterality Date  . Cholecystectomy    . Cesarean section    . Acl replacement      Allergies: Tape and Percocet  Medications: Prior to Admission medications   Medication Sig Start Date End Date Taking? Authorizing Provider  amoxicillin-clavulanate (AUGMENTIN) 875-125 MG per tablet Take 1 tablet by mouth 2 (two) times daily. 09/01/15   Roselee Culver, MD  chlorpheniramine-HYDROcodone (TUSSIONEX PENNKINETIC ER) 10-8 MG/5ML SUER Take 5 mLs by mouth 2 (two) times daily. 09/01/15   Roselee Culver, MD  citalopram (CELEXA) 40 MG tablet 60 mg daily.     Historical Provider, MD  clonazePAM (KLONOPIN) 1 MG tablet Take 2 mg by mouth at bedtime.     Historical Provider, MD  cyclobenzaprine (FLEXERIL) 10 MG tablet Take 1 tablet at night 09/17/15   Darlyne Russian, MD  diazepam (VALIUM) 10 MG tablet Take 1 tablet (10 mg total) by mouth once. 10/03/15   Marybelle Killings, MD    etonogestrel-ethinyl estradiol (NUVARING) 0.12-0.015 MG/24HR vaginal ring Place 1 each vaginally every 28 (twenty-eight) days. Insert vaginally and leave in place for 3 consecutive weeks, then remove for 1 week.    Historical Provider, MD  ibuprofen (ADVIL,MOTRIN) 400 MG tablet Take 400 mg by mouth every 6 (six) hours as needed.    Historical Provider, MD  lisinopril (PRINIVIL,ZESTRIL) 10 MG tablet Take 1 tablet (10 mg total) by mouth daily. 09/07/15   Roselee Culver, MD  omeprazole (PRILOSEC) 20 MG capsule Take 1 capsule (20 mg total) by mouth daily as needed. 08/09/15   Robyn Haber, MD  pseudoephedrine-guaifenesin [facility] D) 60-600 MG per tablet Take 1 tablet by mouth every 12 (twelve) hours. 09/01/15 08/31/16  Roselee Culver, MD     Family History  Problem Relation Age of Onset  . Diabetes Mother   . Rheum arthritis Mother   . Cancer Father     esophageal  . Cataracts Father   . Lupus Sister   . Allergies Son   . Anxiety disorder Son   . Cancer Maternal Grandmother     breast  . Heart attack Maternal Grandfather   . Cancer Paternal Grandmother     pancreatic  . Heart disease Paternal Grandmother   . Heart attack Paternal Grandfather     Social History   Social History  . Marital Status: Divorced    Spouse Name: N/A  .  Number of Children: N/A  . Years of Education: N/A   Occupational History  . Manager [employer]   Social History Main Topics  . Smoking status: Never Smoker   . Smokeless tobacco: Never Used  . Alcohol Use: 0.0 oz/week     Comment: social 1 drink  . Drug Use: No  . Sexual Activity: Yes   Other Topics Concern  . Not on file   Social History Narrative   Divorced. Education: [employer]. Pt does exercise.       Review of Systems  Vital Signs: There were no vitals taken for this visit.  Physical Exam  Constitutional: She is oriented to person, place, and time. She appears well-developed and well-nourished.  Musculoskeletal:   The left lower extremity is warm and pink. There is no evidence of skin breakdown. The laser entry site along the calf is well healed. Along the course of the greater saphenous vein, there is no evidence of erythema or tenderness. Pulses are intact distally. There is no evidence of edema in the left lower extremity.  Neurological: She is alert and oriented to person, place, and time.     Imaging: US Venous Img Lower Unilateral Left  11/22/2015  CLINICAL DATA:  One week status post laser ablation of the left greater saphenous vein. EXAM: LEFT LOWER EXTREMITY VENOUS DUPLEX ULTRASOUND TECHNIQUE: Doppler venous assessment of the left lower extremity deep venous system was performed, including characterization of spectral flow, compressibility, and phasicity. COMPARISON:  10/17/2015 FINDINGS: There is complete compressibility of the left common femoral, femoral, and popliteal veins. Doppler analysis demonstrates respiratory phasicity and augmentation of flow with calf compression. The greater saphenous vein is thrombosed throughout its course. A varicosity extending across the thigh towards the knee is thrombosed. Varicosities behind the knee are non visible. IMPRESSION: No evidence of DVT. Successful occlusion of the greater saphenous vein as well as varicosities across the thigh. The varicosities posterior to the knee are no longer evident. Electronically Signed   By: Marybelle Killings M.D.   On: 11/22/2015 15:19    Labs:  CBC: No results for input(s): WBC, HGB, HCT, PLT in the last 8760 hours.  COAGS: No results for input(s): INR, APTT in the last 8760 hours.  BMP: No results for input(s): NA, K, CL, CO2, GLUCOSE, BUN, CALCIUM, CREATININE, GFRNONAA, GFRAA in the last 8760 hours.  Invalid input(s): CMP  LIVER FUNCTION TESTS: No results for input(s): BILITOT, AST, ALT, ALKPHOS, PROT, ALBUMIN in the last 8760 hours.  TUMOR MARKERS: No results for input(s): AFPTM, CEA, CA199, CHROMGRNA in the last  8760 hours.  Assessment and Plan:  Ms. Rinn has undergone successful treatment of venous insufficiency in her left lower extremity with complete occlusion of the left greater saphenous vein. Varicosities are either thrombosed or nonvisualized at this time. She will be advanced to full activities and will undergo treatment of her right lower extremity.  Thank you for this interesting consult.  I greatly enjoyed meeting NAYLEE MISE and look forward to participating in their care.  A copy of this report was sent to the requesting provider on this date.  Signed: Ryan Palermo, ART A 11/22/2015, 3:24 PM   I spent a total of   15 Minutes in face to face in clinical consultation, greater than 50% of which was counseling/coordinating care for venous insufficiency.

## 2015-12-07 ENCOUNTER — Other Ambulatory Visit: Payer: Self-pay | Admitting: Nurse Practitioner

## 2015-12-07 DIAGNOSIS — N6321 Unspecified lump in the left breast, upper outer quadrant: Secondary | ICD-10-CM

## 2015-12-11 ENCOUNTER — Ambulatory Visit
Admission: RE | Admit: 2015-12-11 | Discharge: 2015-12-11 | Disposition: A | Discharge status: Home / Self Care | Payer: BLUE CROSS/BLUE SHIELD | Source: Ambulatory Visit | Attending: Nurse Practitioner | Admitting: Nurse Practitioner

## 2015-12-11 DIAGNOSIS — N6321 Unspecified lump in the left breast, upper outer quadrant: Secondary | ICD-10-CM

## 2015-12-20 ENCOUNTER — Other Ambulatory Visit (HOSPITAL_COMMUNITY): Payer: Self-pay | Admitting: Interventional Radiology

## 2015-12-20 DIAGNOSIS — I8390 Asymptomatic varicose veins of unspecified lower extremity: Secondary | ICD-10-CM

## 2016-01-08 ENCOUNTER — Ambulatory Visit
Admission: RE | Admit: 2016-01-08 | Discharge: 2016-01-08 | Disposition: A | Discharge status: Home / Self Care | Payer: BLUE CROSS/BLUE SHIELD | Source: Ambulatory Visit | Attending: Interventional Radiology | Admitting: Interventional Radiology

## 2016-01-08 DIAGNOSIS — I8390 Asymptomatic varicose veins of unspecified lower extremity: Secondary | ICD-10-CM

## 2016-01-08 NOTE — Progress Notes (Signed)
Chief Complaint: Patient was seen in consultation today for No chief complaint on file.  at the request of Semaje Kinker  Referring Physician(s): Kellene Mccleary  History of Present Illness: [name] is a 50 y.o. female with bilateral lower extremity venous insufficiency. She is oriented to gone laser therapy of the left lower extremity greater saphenous vein with success. She has continued to wear her compression stockings bilaterally. Her symptoms have resolved in the left lower extremity. She continues to have symptoms in the right lower extremity related to swelling of ankles and pain at the location of her varicosities, in the medial thigh and calf. She has undergone laser therapy of the right greater saphenous vein in the remote past, however the symptoms have recurred. The pain continues to interfere with activities of daily living as previously described. If anything, her symptoms have worsened.  Past Medical History  Diagnosis Date  . Depression     Adjustment disorder with mixed anxiety and depressed mood   . Anxiety     Adjustment disorder with mixed anxiety and depressed mood     Past Surgical History  Procedure Laterality Date  . Cholecystectomy    . Cesarean section    . Acl replacement      Allergies: Tape and Percocet  Medications: Prior to Admission medications   Medication Sig Start Date End Date Taking? Authorizing Provider  amoxicillin-clavulanate (AUGMENTIN) 875-125 MG per tablet Take 1 tablet by mouth 2 (two) times daily. 09/01/15   Roselee Culver, MD  chlorpheniramine-HYDROcodone (TUSSIONEX PENNKINETIC ER) 10-8 MG/5ML SUER Take 5 mLs by mouth 2 (two) times daily. 09/01/15   Roselee Culver, MD  citalopram (CELEXA) 40 MG tablet 60 mg daily.     Historical Provider, MD  clonazePAM (KLONOPIN) 1 MG tablet Take 2 mg by mouth at bedtime.     Historical Provider, MD  cyclobenzaprine (FLEXERIL) 10 MG tablet Take 1 tablet at night 09/17/15   Darlyne Russian, MD   diazepam (VALIUM) 10 MG tablet Take 1 tablet (10 mg total) by mouth once. 10/03/15   Marybelle Killings, MD  etonogestrel-ethinyl estradiol (NUVARING) 0.12-0.015 MG/24HR vaginal ring Place 1 each vaginally every 28 (twenty-eight) days. Insert vaginally and leave in place for 3 consecutive weeks, then remove for 1 week.    Historical Provider, MD  ibuprofen (ADVIL,MOTRIN) 400 MG tablet Take 400 mg by mouth every 6 (six) hours as needed.    Historical Provider, MD  lisinopril (PRINIVIL,ZESTRIL) 10 MG tablet Take 1 tablet (10 mg total) by mouth daily. 09/07/15   Roselee Culver, MD  omeprazole (PRILOSEC) 20 MG capsule Take 1 capsule (20 mg total) by mouth daily as needed. 08/09/15   Robyn Haber, MD  pseudoephedrine-guaifenesin [facility] D) 60-600 MG per tablet Take 1 tablet by mouth every 12 (twelve) hours. 09/01/15 08/31/16  Roselee Culver, MD     Family History  Problem Relation Age of Onset  . Diabetes Mother   . Rheum arthritis Mother   . Cancer Father     esophageal  . Cataracts Father   . Lupus Sister   . Allergies Son   . Anxiety disorder Son   . Cancer Maternal Grandmother     breast  . Heart attack Maternal Grandfather   . Cancer Paternal Grandmother     pancreatic  . Heart disease Paternal Grandmother   . Heart attack Paternal Grandfather     Social History   Social History  . Marital Status: Divorced  Spouse Name: N/A  . Number of Children: N/A  . Years of Education: N/A   Occupational History  . Manager [employer]   Social History Main Topics  . Smoking status: Never Smoker   . Smokeless tobacco: Never Used  . Alcohol Use: 0.0 oz/week     Comment: social 1 drink  . Drug Use: No  . Sexual Activity: Yes   Other Topics Concern  . Not on file   Social History Narrative   Divorced. Education: [employer]. Pt does exercise.    Review of Systems: A 12 point ROS discussed and pertinent positives are indicated in the HPI above.  All other systems are  negative.  Review of Systems  Vital Signs: There were no vitals taken for this visit.  Physical Exam  Constitutional: She appears well-developed and well-nourished.  Musculoskeletal:  Examination of the left lower extremity demonstrates no significant edema or skin breakdown. Pulses are intact distally.  In the right lower extremity, there is 1+ edema at the ankle. The extremity is warm and pink. Pulses are intact distally. No visible varicosities are evident.    Mallampati Score:     Imaging: US Breast Ltd Uni Left Inc Axilla  12/11/2015  CLINICAL DATA:  50 year old female presenting for evaluation of a palpable tender area of concern in the upper-outer left breast. EXAM: DIGITAL DIAGNOSTIC BILATERAL MAMMOGRAM WITH 3D TOMOSYNTHESIS AND CAD LEFT BREAST ULTRASOUND COMPARISON:  Previous exam(s). ACR Breast Density Category b: There are scattered areas of fibroglandular density. FINDINGS: A palpable marker has been placed on the upper-outer quadrant of the left breast. No suspicious calcifications, masses or areas of distortion are seen in the bilateral breasts. Mammographic images were processed with CAD. Physical exam of the upper-outer left breast demonstrates no discrete suspicious palpable mass. Ultrasound targeted to the palpable area of concern in the upper-outer quadrant of the left breast demonstrates normal fibroglandular tissue. No suspicious masses or areas of shadowing are identified. IMPRESSION: 1. No mammographic or targeted sonographic correlate for the palpable area of concern in the upper-outer left breast. 2.  No mammographic evidence of malignancy in the bilateral breasts. RECOMMENDATION: 1. Clinical follow-up recommended for the palpable area of concern in the upper-outer left breast. Any further workup should be based on clinical grounds. 2.  Screening mammogram in one year.(Code:SM-B-01Y) I have discussed the findings and recommendations with the patient. Results were also  provided in writing at the conclusion of the visit. If applicable, a reminder letter will be sent to the patient regarding the next appointment. BI-RADS CATEGORY  1: Negative. Electronically Signed   By: Ammie Ferrier M.D.   On: 12/11/2015 14:46   Mm Diag Breast Tomo Bilateral  12/11/2015  CLINICAL DATA:  50 year old female presenting for evaluation of a palpable tender area of concern in the upper-outer left breast. EXAM: DIGITAL DIAGNOSTIC BILATERAL MAMMOGRAM WITH 3D TOMOSYNTHESIS AND CAD LEFT BREAST ULTRASOUND COMPARISON:  Previous exam(s). ACR Breast Density Category b: There are scattered areas of fibroglandular density. FINDINGS: A palpable marker has been placed on the upper-outer quadrant of the left breast. No suspicious calcifications, masses or areas of distortion are seen in the bilateral breasts. Mammographic images were processed with CAD. Physical exam of the upper-outer left breast demonstrates no discrete suspicious palpable mass. Ultrasound targeted to the palpable area of concern in the upper-outer quadrant of the left breast demonstrates normal fibroglandular tissue. No suspicious masses or areas of shadowing are identified. IMPRESSION: 1. No mammographic or targeted sonographic  correlate for the palpable area of concern in the upper-outer left breast. 2.  No mammographic evidence of malignancy in the bilateral breasts. RECOMMENDATION: 1. Clinical follow-up recommended for the palpable area of concern in the upper-outer left breast. Any further workup should be based on clinical grounds. 2.  Screening mammogram in one year.(Code:SM-B-01Y) I have discussed the findings and recommendations with the patient. Results were also provided in writing at the conclusion of the visit. If applicable, a reminder letter will be sent to the patient regarding the next appointment. BI-RADS CATEGORY  1: Negative. Electronically Signed   By: Ammie Ferrier M.D.   On: 12/11/2015 14:46   Venous Doppler of  the right lower extremity was performed. She has had previous treatment of the right lower extremity greater saphenous vein. The existing greater saphenous vein is discontinuous along the thigh and calf. There are several varicosities in the thigh and calf leading to the patent segments of the residual greater saphenous vein. There is no evidence of DVT in the right lower extremity.   Labs:  CBC: No results for input(s): WBC, HGB, HCT, PLT in the last 8760 hours.  COAGS: No results for input(s): INR, APTT in the last 8760 hours.  BMP: No results for input(s): NA, K, CL, CO2, GLUCOSE, BUN, CALCIUM, CREATININE, GFRNONAA, GFRAA in the last 8760 hours.  Invalid input(s): CMP  LIVER FUNCTION TESTS: No results for input(s): BILITOT, AST, ALT, ALKPHOS, PROT, ALBUMIN in the last 8760 hours.  TUMOR MARKERS: No results for input(s): AFPTM, CEA, CA199, CHROMGRNA in the last 8760 hours.  Assessment and Plan:  Ms. Herlong continues to have symptoms related to venous insufficiency in the right lower extremity. She has previously undergone laser therapy in the right lower extremity but varicosities and discontinuous patency of the greater saphenous vein has recurred. This will be best treated with sclerotherapy.  Thank you for this interesting consult.  I greatly enjoyed meeting [name] and look forward to participating in their care.  A copy of this report was sent to the requesting provider on this date.  Electronically Signed: Jyl Chico, ART A 01/08/2016, 2:28 PM   I spent a total of   15 Minutes in face to face in clinical consultation, greater than 50% of which was counseling/coordinating care for lower extremity venous insufficiency.

## 2016-01-10 ENCOUNTER — Ambulatory Visit (INDEPENDENT_AMBULATORY_CARE_PROVIDER_SITE_OTHER): Payer: BLUE CROSS/BLUE SHIELD | Admitting: Family Medicine

## 2016-01-10 VITALS — BP 122/68 | HR 93 | Temp 98.7°F | Resp 16 | Ht 68.5 in | Wt 250.0 lb

## 2016-01-10 DIAGNOSIS — B029 Zoster without complications: Secondary | ICD-10-CM | POA: Diagnosis not present

## 2016-01-10 MED ORDER — GABAPENTIN 300 MG PO CAPS: 300.0000 mg | ORAL_CAPSULE | Freq: Every day | ORAL | Status: DC

## 2016-01-10 MED ORDER — VALACYCLOVIR HCL 1 G PO TABS: 1000.0000 mg | ORAL_TABLET | Freq: Three times a day (TID) | ORAL | Status: DC

## 2016-01-10 NOTE — Progress Notes (Signed)
   Subjective:    Patient ID: [name], female    DOB: 02-05-1966, 50 y.o.   MRN: [id number] By signing my name below, I, Zola Button, attest that this documentation has been prepared under the direction and in the presence of Robyn Haber, MD.  Electronically Signed: Zola Button, Medical Scribe. 01/10/2016. 8:40 AM.  HPI HPI Comments: [name] is a 50 y.o. female who presents to the [facility] complaining of gradual onset, intermittent, stinging pain to the skin overlying the right shoulder for the past 2 weeks. The pain feels like a sunburn. Patient notes the area is sensitive to the touch. She has been taking care of her mother, who had been dealing with shingles, and she believes she may also have shingles now. Patient denies fever, cough, and sneezing. She does not have a visible rash to the area. She has had chicken pox before.   Review of Systems  Constitutional: Negative for fever.  HENT: Negative for sneezing.   Respiratory: Negative for cough.   Skin: Negative for rash.       Objective:   Physical Exam CONSTITUTIONAL: Well developed/well nourished HEAD: Normocephalic/atraumatic EYES: EOM/PERRL ENMT: Mucous membranes moist NECK: supple no meningeal signs SPINE: entire spine nontender CV: S1/S2 noted, no murmurs/rubs/gallops noted LUNGS: Lungs are clear to auscultation bilaterally, no apparent distress ABDOMEN: soft, nontender, no rebound or guarding GU: no cva tenderness NEURO: Pt is awake/alert, moves all extremitiesx4 EXTREMITIES: pulses normal, full ROM SKIN: warm, color normal. Hypersensitive skin over the right scapula. PSYCH: no abnormalities of mood noted        Assessment & Plan:   This chart was scribed in my presence and reviewed by me personally.    ICD-9-CM ICD-10-CM   1. Herpes zoster 053.9 B02.9 gabapentin (NEURONTIN) 300 MG capsule     valACYclovir (VALTREX) 1000 MG tablet   This appears to be zoster sine  herpete  Signed, Robyn Haber, MD

## 2016-01-12 ENCOUNTER — Other Ambulatory Visit: Payer: Self-pay | Admitting: Family Medicine

## 2016-01-14 ENCOUNTER — Telehealth: Payer: Self-pay

## 2016-01-14 DIAGNOSIS — B029 Zoster without complications: Secondary | ICD-10-CM

## 2016-01-14 MED ORDER — VALACYCLOVIR HCL 1 G PO TABS: 1000.0000 mg | ORAL_TABLET | Freq: Three times a day (TID) | ORAL | Status: DC

## 2016-01-14 NOTE — Telephone Encounter (Signed)
Meds ordered this encounter  Medications  . valACYclovir (VALTREX) 1000 MG tablet    Sig: Take 1 tablet (1,000 mg total) by mouth 3 (three) times daily.    Dispense:  30 tablet    Refill:  0    PLEASE USE THIS QUANTITY TO REPLACE SCRIPT WRITTEN 01/10/16.

## 2016-01-14 NOTE — Telephone Encounter (Signed)
Pharm sent req to clarify quantity Rxd of valtrex. Pt reported that she is supposed to take the valtrex TID for 10 days and was only given #20 tablets. OV notes "plan" from 2/2 OV says: "This appears to be zoster sine herpete",   Please advise in Dr Lenn Cal absence. I will pend for #30.

## 2016-01-24 ENCOUNTER — Ambulatory Visit: Payer: BLUE CROSS/BLUE SHIELD

## 2016-01-24 ENCOUNTER — Other Ambulatory Visit: Payer: BLUE CROSS/BLUE SHIELD

## 2016-01-31 ENCOUNTER — Other Ambulatory Visit: Payer: BLUE CROSS/BLUE SHIELD

## 2016-03-06 ENCOUNTER — Other Ambulatory Visit: Payer: BLUE CROSS/BLUE SHIELD

## 2016-03-06 ENCOUNTER — Other Ambulatory Visit: Payer: Self-pay

## 2016-03-06 ENCOUNTER — Ambulatory Visit: Payer: BLUE CROSS/BLUE SHIELD

## 2016-03-06 MED ORDER — LISINOPRIL 10 MG PO TABS: 10.0000 mg | ORAL_TABLET | Freq: Every day | ORAL | Status: DC

## 2016-03-25 ENCOUNTER — Other Ambulatory Visit (HOSPITAL_COMMUNITY): Payer: Self-pay | Admitting: Interventional Radiology

## 2016-03-25 DIAGNOSIS — I8390 Asymptomatic varicose veins of unspecified lower extremity: Secondary | ICD-10-CM

## 2016-04-11 ENCOUNTER — Other Ambulatory Visit (HOSPITAL_COMMUNITY): Payer: Self-pay | Admitting: Interventional Radiology

## 2016-04-11 DIAGNOSIS — I8390 Asymptomatic varicose veins of unspecified lower extremity: Secondary | ICD-10-CM

## 2016-04-15 ENCOUNTER — Ambulatory Visit: Payer: BLUE CROSS/BLUE SHIELD

## 2016-04-15 ENCOUNTER — Other Ambulatory Visit: Payer: BLUE CROSS/BLUE SHIELD

## 2016-04-22 ENCOUNTER — Other Ambulatory Visit: Payer: Self-pay | Admitting: Family Medicine

## 2016-04-24 ENCOUNTER — Other Ambulatory Visit: Payer: BLUE CROSS/BLUE SHIELD

## 2016-04-24 ENCOUNTER — Ambulatory Visit
Admission: RE | Admit: 2016-04-24 | Discharge: 2016-04-24 | Disposition: A | Discharge status: Home / Self Care | Payer: BLUE CROSS/BLUE SHIELD | Source: Ambulatory Visit | Attending: Interventional Radiology | Admitting: Interventional Radiology

## 2016-04-24 ENCOUNTER — Other Ambulatory Visit (HOSPITAL_COMMUNITY): Payer: Self-pay | Admitting: Interventional Radiology

## 2016-04-24 DIAGNOSIS — I8390 Asymptomatic varicose veins of unspecified lower extremity: Secondary | ICD-10-CM

## 2016-04-24 NOTE — Progress Notes (Signed)
Patient ID: [name], female   DOB: Feb 23, 1966, 50 y.o.   MRN: [id number]       Chief Complaint: Patient was seen in consultation today for No chief complaint on file.  at the request of Sherrice Creekmore  Referring Physician(s): Jeannifer Drakeford  History of Present Illness: [name] is a 50 y.o. female who is 6 months status post laser occlusion of the left greater saphenous vein. She has done very well and her symptoms are markedly improved. This includes the area of redness and swelling at the medial left ankle. She has new pain along the proximal and posterior left calf that she associates with spider veins. She denies any skin breakdown. She has been wearing her stockings. She continues to have pain in the medial right thigh and calf secondary to well-known varicosities.  Past Medical History  Diagnosis Date  . Depression     Adjustment disorder with mixed anxiety and depressed mood   . Anxiety     Adjustment disorder with mixed anxiety and depressed mood     Past Surgical History  Procedure Laterality Date  . Cholecystectomy    . Cesarean section    . Acl replacement      Allergies: Tape and Percocet  Medications: Prior to Admission medications   Medication Sig Start Date End Date Taking? Authorizing Provider  citalopram (CELEXA) 40 MG tablet 60 mg daily.     Historical Provider, MD  clonazePAM (KLONOPIN) 1 MG tablet Take 2 mg by mouth at bedtime.     Historical Provider, MD  cyclobenzaprine (FLEXERIL) 10 MG tablet Take 1 tablet at night Patient not taking: Reported on 01/10/2016 09/17/15   Darlyne Russian, MD  diazepam (VALIUM) 10 MG tablet Take 1 tablet (10 mg total) by mouth once. Patient not taking: Reported on 01/10/2016 10/03/15   Marybelle Killings, MD  etonogestrel-ethinyl estradiol (NUVARING) 0.12-0.015 MG/24HR vaginal ring Place 1 each vaginally every 28 (twenty-eight) days. Insert vaginally and leave in place for 3 consecutive weeks, then remove for 1 week.    Historical  Provider, MD  gabapentin (NEURONTIN) 300 MG capsule Take 1 capsule (300 mg total) by mouth at bedtime. 01/10/16   Robyn Haber, MD  ibuprofen (ADVIL,MOTRIN) 400 MG tablet Take 400 mg by mouth every 6 (six) hours as needed.    Historical Provider, MD  lisinopril (PRINIVIL,ZESTRIL) 10 MG tablet Take 1 tablet (10 mg total) by mouth daily. 03/06/16   Robyn Haber, MD  omeprazole (PRILOSEC) 20 MG capsule TAKE 1 CAPSULE (20 MG TOTAL) BY MOUTH DAILY AS NEEDED. 04/22/16   Robyn Haber, MD  valACYclovir (VALTREX) 1000 MG tablet Take 1 tablet (1,000 mg total) by mouth 3 (three) times daily. 01/14/16   Harrison Mons, PA-C     Family History  Problem Relation Age of Onset  . Diabetes Mother   . Rheum arthritis Mother   . Cancer Father     esophageal  . Cataracts Father   . Lupus Sister   . Allergies Son   . Anxiety disorder Son   . Cancer Maternal Grandmother     breast  . Heart attack Maternal Grandfather   . Cancer Paternal Grandmother     pancreatic  . Heart disease Paternal Grandmother   . Heart attack Paternal Grandfather     Social History   Social History  . Marital Status: Divorced    Spouse Name: N/A  . Number of Children: N/A  . Years of Education: N/A   Occupational History  .  Manager [employer]   Social History Main Topics  . Smoking status: Never Smoker   . Smokeless tobacco: Never Used  . Alcohol Use: 0.0 oz/week     Comment: social 1 drink  . Drug Use: No  . Sexual Activity: Yes   Other Topics Concern  . Not on file   Social History Narrative   Divorced. Education: [employer]. Pt does exercise.     Review of Systems: A 12 point ROS discussed and pertinent positives are indicated in the HPI above.  All other systems are negative.  Review of Systems  Vital Signs: There were no vitals taken for this visit.  Physical Exam  Constitutional: She is oriented to person, place, and time. She appears well-developed and well-nourished.  HENT:  Head:  Normocephalic and atraumatic.  Musculoskeletal:  Examination of the bilateral lower extremities demonstrates no evidence of pitting edema. There is slight edema and prominence of the soft tissues in the medial ankles bilaterally. Scattered spider veins are noted. Pulses are intact distally. No skin breakdown. No visible varicosities are present.  Neurological: She is alert and oriented to person, place, and time.    Mallampati Score:     Imaging: US Venous Img Lower Unilateral Left  04/24/2016  CLINICAL DATA:  Six-month follow-up left lower extremity venous insufficiency status post ablation of the greater saphenous vein. EXAM: LEFT LOWER EXTREMITY VENOUS DUPLEX ULTRASOUND TECHNIQUE: Doppler venous assessment of the left lower extremity deep venous system was performed, including characterization of spectral flow, compressibility, and phasicity. COMPARISON:  None. FINDINGS: There is complete compressibility of the left common femoral, femoral, and popliteal veins. Doppler analysis demonstrates respiratory phasicity and augmentation of flow with calf compression. The greater saphenous vein throughout the thigh is nonvisualized compatible with successful occlusion. Below-the-knee, of the greater saphenous vein is compressible but diminutive. There is a very tiny varicosity in the subcutaneous fat of the posterior upper calf and medial upper calf. IMPRESSION: No evidence of DVT. Successful occlusion of the greater saphenous vein There is a new subtle varicosity in the proximal posterior and medial calf. Electronically Signed   By: Marybelle Killings M.D.   On: 04/24/2016 11:19   [place] Injec Sclerotherapy Single  04/24/2016  CLINICAL DATA:  Bilateral lower extremity venous insufficiency EXAM: ULTRASOUND INJECTION SCLEROTHERAPHY SINGLE MEDICATIONS: MEDICATIONS None. ANESTHESIA/SEDATION: None COMPLICATIONS: None immediate. PROCEDURE: Informed written consent was obtained from the patient after a thorough discussion  of the procedural risks, benefits and alternatives. All questions were addressed. A timeout was performed prior to the initiation of the procedure. The left medial calf, right medial fine, and right medial calf were prepped and draped in a sterile fashion utilizing ChloraPrep. A sterile ultrasound probe occur over was utilized. Under direct sonographic guidance, foam sclerotherapy injection, utilizing Polidocinol, into a varicosity in the medial and posterior proximal left calf was performed. Imaging across the varicosity demonstrates adequate deposition of sclerosis and throughout the venous network. Under direct sonographic guidance, foam sclerotherapy was injected into the varicosity along the medial right distal thigh and proximal calf. Sonographic imaging confirms adequate deposition throughout the network of varicosities. FINDINGS: Imaging confirms needle placement within the varicosities and foam sclerosant deposition throughout the bilateral varicose vein that works. IMPRESSION: Successful bilateral lower extremity foam sclerotherapy for bilateral varicose veins. Electronically Signed   By: Marybelle Killings M.D.   On: 04/24/2016 11:38   [place] Injec Sclerotherapy Single  04/24/2016  CLINICAL DATA:  Bilateral lower extremity venous insufficiency EXAM: ULTRASOUND INJECTION SCLEROTHERAPHY  SINGLE MEDICATIONS: MEDICATIONS None. ANESTHESIA/SEDATION: None COMPLICATIONS: None immediate. PROCEDURE: Informed written consent was obtained from the patient after a thorough discussion of the procedural risks, benefits and alternatives. All questions were addressed. A timeout was performed prior to the initiation of the procedure. The left medial calf, right medial fine, and right medial calf were prepped and draped in a sterile fashion utilizing ChloraPrep. A sterile ultrasound probe occur over was utilized. Under direct sonographic guidance, foam sclerotherapy injection, utilizing Polidocinol, into a varicosity in the medial  and posterior proximal left calf was performed. Imaging across the varicosity demonstrates adequate deposition of sclerosis and throughout the venous network. Under direct sonographic guidance, foam sclerotherapy was injected into the varicosity along the medial right distal thigh and proximal calf. Sonographic imaging confirms adequate deposition throughout the network of varicosities. FINDINGS: Imaging confirms needle placement within the varicosities and foam sclerosant deposition throughout the bilateral varicose vein that works. IMPRESSION: Successful bilateral lower extremity foam sclerotherapy for bilateral varicose veins. Electronically Signed   By: Marybelle Killings M.D.   On: 04/24/2016 11:38    Labs:  CBC: No results for input(s): WBC, HGB, HCT, PLT in the last 8760 hours.  COAGS: No results for input(s): INR, APTT in the last 8760 hours.  BMP: No results for input(s): NA, K, CL, CO2, GLUCOSE, BUN, CALCIUM, CREATININE, GFRNONAA, GFRAA in the last 8760 hours.  Invalid input(s): CMP  LIVER FUNCTION TESTS: No results for input(s): BILITOT, AST, ALT, ALKPHOS, PROT, ALBUMIN in the last 8760 hours.  TUMOR MARKERS: No results for input(s): AFPTM, CEA, CA199, CHROMGRNA in the last 8760 hours.  Assessment and Plan:  Overall, the result in her left lower extremity is positive. She does have some new symptoms secondary to a small varicosity in the posterior proximal calf. This will be treated with sclerotherapy. It is likely secondary to a perforating branch. Varicosities in the right thigh and calf will also be treated today with sclerotherapy. The procedure, risks, benefits, and alternatives of sclerotherapy were explained. She understands and her questions were answered. We will proceed with sclerotherapy. She'll subsequently wear stockings and follow-up in 2-4 weeks. She was instructed to continue light activity throughout the early post sclerotherapy period.  Thank you for this interesting  consult.  I greatly enjoyed meeting [name] and look forward to participating in their care.  A copy of this report was sent to the requesting provider on this date.  Electronically Signed: Coral [name], ART A 04/24/2016, 11:44 AM   I spent a total of   15 Minutes in face to face in clinical consultation, greater than 50% of which was counseling/coordinating care for lower extremity venous insufficiency and sclerotherapy.

## 2016-04-26 ENCOUNTER — Other Ambulatory Visit: Payer: Self-pay | Admitting: Family Medicine

## 2016-04-26 DIAGNOSIS — I10 Essential (primary) hypertension: Secondary | ICD-10-CM

## 2016-04-26 MED ORDER — LISINOPRIL 10 MG PO TABS: 10.0000 mg | ORAL_TABLET | Freq: Every day | ORAL | Status: DC

## 2016-04-26 MED ORDER — OMEPRAZOLE 20 MG PO CPDR: DELAYED_RELEASE_CAPSULE | ORAL | Status: DC

## 2016-05-15 ENCOUNTER — Ambulatory Visit
Admission: RE | Admit: 2016-05-15 | Discharge: 2016-05-15 | Disposition: A | Discharge status: Home / Self Care | Payer: BLUE CROSS/BLUE SHIELD | Source: Ambulatory Visit | Attending: Interventional Radiology | Admitting: Interventional Radiology

## 2016-05-15 DIAGNOSIS — I8390 Asymptomatic varicose veins of unspecified lower extremity: Secondary | ICD-10-CM

## 2016-05-15 NOTE — Progress Notes (Signed)
Patient ID: Mary Pereyraadja F Hur, female   DOB: 08/04/1966, 50 y.o.   MRN: [id number]    Chief Complaint: Patient was seen in consultation today for No chief complaint on file.  at the request of Enma Maeda  Referring Physician(s): Idona Stach  Supervising Physician: Jolaine ClickHoss, Labrina Lines  Patient Status: In-pt / Out-pt  History of Present Illness: [name] is a 50 y.o. female who underwent sclerotherapy 3 weeks ago for left calf varicosities, right thigh varicosities, and right calf varicosities. She denies any fevers or signs of infection on her legs. The discolored areas along her medial ankles continue to slowly improve. She does have some pain along the varicosities of the medial right thigh.  Past Medical History  Diagnosis Date  . Depression     Adjustment disorder with mixed anxiety and depressed mood   . Anxiety     Adjustment disorder with mixed anxiety and depressed mood     Past Surgical History  Procedure Laterality Date  . Cholecystectomy    . Cesarean section    . Acl replacement      Allergies: Tape and Percocet  Medications: Prior to Admission medications   Medication Sig Start Date End Date Taking? Authorizing Provider  citalopram (CELEXA) 40 MG tablet 60 mg daily.     Historical Provider, MD  clonazePAM (KLONOPIN) 1 MG tablet Take 2 mg by mouth at bedtime.     Historical Provider, MD  cyclobenzaprine (FLEXERIL) 10 MG tablet Take 1 tablet at night Patient not taking: Reported on 01/10/2016 09/17/15   Collene GobbleSteven A Daub, MD  diazepam (VALIUM) 10 MG tablet Take 1 tablet (10 mg total) by mouth once. Patient not taking: Reported on 01/10/2016 10/03/15   Jolaine ClickArthur Stephaniemarie Stoffel, MD  etonogestrel-ethinyl estradiol (NUVARING) 0.12-0.015 MG/24HR vaginal ring Place 1 each vaginally every 28 (twenty-eight) days. Insert vaginally and leave in place for 3 consecutive weeks, then remove for 1 week.    Historical Provider, MD  gabapentin (NEURONTIN) 300 MG capsule Take 1 capsule (300 mg total) by  mouth at bedtime. 01/10/16   Elvina SidleKurt Lauenstein, MD  ibuprofen (ADVIL,MOTRIN) 400 MG tablet Take 400 mg by mouth every 6 (six) hours as needed.    Historical Provider, MD  lisinopril (PRINIVIL,ZESTRIL) 10 MG tablet Take 1 tablet (10 mg total) by mouth daily. 04/26/16   Elvina SidleKurt Lauenstein, MD  omeprazole (PRILOSEC) 20 MG capsule TAKE 1 CAPSULE (20 MG TOTAL) BY MOUTH DAILY AS NEEDED. 04/26/16   Elvina SidleKurt Lauenstein, MD  valACYclovir (VALTREX) 1000 MG tablet Take 1 tablet (1,000 mg total) by mouth 3 (three) times daily. 01/14/16   Porfirio Oarhelle Jeffery, PA-C     Family History  Problem Relation Age of Onset  . Diabetes Mother   . Rheum arthritis Mother   . Cancer Father     esophageal  . Cataracts Father   . Lupus Sister   . Allergies Son   . Anxiety disorder Son   . Cancer Maternal Grandmother     breast  . Heart attack Maternal Grandfather   . Cancer Paternal Grandmother     pancreatic  . Heart disease Paternal Grandmother   . Heart attack Paternal Grandfather     Social History   Social History  . Marital Status: Divorced    Spouse Name: N/A  . Number of Children: N/A  . Years of Education: N/A   Occupational History  . Manager [employer]   Social History Main Topics  . Smoking status: Never Smoker   . Smokeless  tobacco: Never Used  . Alcohol Use: 0.0 oz/week     Comment: social 1 drink  . Drug Use: No  . Sexual Activity: Yes   Other Topics Concern  . Not on file   Social History Narrative   Divorced. Education: [employer]. Pt does exercise.     Review of Systems: A 12 point ROS discussed and pertinent positives are indicated in the HPI above.  All other systems are negative.  Review of Systems  Vital Signs: There were no vitals taken for this visit.  Physical Exam   Imaging: US Venous Img Lower Unilateral Left  04/24/2016  CLINICAL DATA:  Six-month follow-up left lower extremity venous insufficiency status post ablation of the greater saphenous vein. EXAM: LEFT LOWER  EXTREMITY VENOUS DUPLEX ULTRASOUND TECHNIQUE: Doppler venous assessment of the left lower extremity deep venous system was performed, including characterization of spectral flow, compressibility, and phasicity. COMPARISON:  None. FINDINGS: There is complete compressibility of the left common femoral, femoral, and popliteal veins. Doppler analysis demonstrates respiratory phasicity and augmentation of flow with calf compression. The greater saphenous vein throughout the thigh is nonvisualized compatible with successful occlusion. Below-the-knee, of the greater saphenous vein is compressible but diminutive. There is a very tiny varicosity in the subcutaneous fat of the posterior upper calf and medial upper calf. IMPRESSION: No evidence of DVT. Successful occlusion of the greater saphenous vein There is a new subtle varicosity in the proximal posterior and medial calf. Electronically Signed   By: Marybelle Killings M.D.   On: 04/24/2016 11:19   [place] Injec Sclerotherapy Single  04/24/2016  CLINICAL DATA:  Bilateral lower extremity venous insufficiency EXAM: ULTRASOUND INJECTION SCLEROTHERAPHY SINGLE MEDICATIONS: MEDICATIONS None. ANESTHESIA/SEDATION: None COMPLICATIONS: None immediate. PROCEDURE: Informed written consent was obtained from the patient after a thorough discussion of the procedural risks, benefits and alternatives. All questions were addressed. A timeout was performed prior to the initiation of the procedure. The left medial calf, right medial fine, and right medial calf were prepped and draped in a sterile fashion utilizing ChloraPrep. A sterile ultrasound probe occur over was utilized. Under direct sonographic guidance, foam sclerotherapy injection, utilizing Polidocinol, into a varicosity in the medial and posterior proximal left calf was performed. Imaging across the varicosity demonstrates adequate deposition of sclerosis and throughout the venous network. Under direct sonographic guidance, foam  sclerotherapy was injected into the varicosity along the medial right distal thigh and proximal calf. Sonographic imaging confirms adequate deposition throughout the network of varicosities. FINDINGS: Imaging confirms needle placement within the varicosities and foam sclerosant deposition throughout the bilateral varicose vein that works. IMPRESSION: Successful bilateral lower extremity foam sclerotherapy for bilateral varicose veins. Electronically Signed   By: Marybelle Killings M.D.   On: 04/24/2016 11:38   [place] Injec Sclerotherapy Single  04/24/2016  CLINICAL DATA:  Bilateral lower extremity venous insufficiency EXAM: ULTRASOUND INJECTION SCLEROTHERAPHY SINGLE MEDICATIONS: MEDICATIONS None. ANESTHESIA/SEDATION: None COMPLICATIONS: None immediate. PROCEDURE: Informed written consent was obtained from the patient after a thorough discussion of the procedural risks, benefits and alternatives. All questions were addressed. A timeout was performed prior to the initiation of the procedure. The left medial calf, right medial fine, and right medial calf were prepped and draped in a sterile fashion utilizing ChloraPrep. A sterile ultrasound probe occur over was utilized. Under direct sonographic guidance, foam sclerotherapy injection, utilizing Polidocinol, into a varicosity in the medial and posterior proximal left calf was performed. Imaging across the varicosity demonstrates adequate deposition of sclerosis and throughout the venous network.  Under direct sonographic guidance, foam sclerotherapy was injected into the varicosity along the medial right distal thigh and proximal calf. Sonographic imaging confirms adequate deposition throughout the network of varicosities. FINDINGS: Imaging confirms needle placement within the varicosities and foam sclerosant deposition throughout the bilateral varicose vein that works. IMPRESSION: Successful bilateral lower extremity foam sclerotherapy for bilateral varicose veins.  Electronically Signed   By: Marybelle Killings M.D.   On: 04/24/2016 11:38   05/15/2016  Limited sonographic imaging of her lower extremities demonstrates occlusion of the varicosities along the medial right thigh. There is also partial occlusion of the varicosities in the right calf as well as the greater saphenous vein below the knee and the left calf, there is occlusion of some of the varicosities with patency of other areas. Overall, there is successful occlusion of varicosities after sclerotherapy.  Labs:  CBC: No results for input(s): WBC, HGB, HCT, PLT in the last 8760 hours.  COAGS: No results for input(s): INR, APTT in the last 8760 hours.  BMP: No results for input(s): NA, K, CL, CO2, GLUCOSE, BUN, CALCIUM, CREATININE, GFRNONAA, GFRAA in the last 8760 hours.  Invalid input(s): CMP  LIVER FUNCTION TESTS: No results for input(s): BILITOT, AST, ALT, ALKPHOS, PROT, ALBUMIN in the last 8760 hours.  TUMOR MARKERS: No results for input(s): AFPTM, CEA, CA199, CHROMGRNA in the last 8760 hours.  Assessment and Plan:  Ms. Zora has undergone successful sclerotherapy in both of her lower extremities. Pain along the varicosities of the right thigh should subside over the next few weeks and she was instructed to advance her activities but continued to wear her compression stockings. She will follow-up in 6 months.    Electronically Signed: Samyrah Bruster, ART A 05/15/2016, 8:56 AM   I spent a total of   15 Minutes in face to face in clinical consultation, greater than 50% of which was counseling/coordinating care for venous insufficiency.

## 2016-09-10 ENCOUNTER — Other Ambulatory Visit: Payer: Self-pay | Admitting: Nurse Practitioner

## 2016-09-10 DIAGNOSIS — N6320 Unspecified lump in the left breast, unspecified quadrant: Principal | ICD-10-CM

## 2016-09-10 DIAGNOSIS — N6323 Unspecified lump in the left breast, lower outer quadrant: Secondary | ICD-10-CM

## 2016-09-10 DIAGNOSIS — N6315 Unspecified lump in the right breast, overlapping quadrants: Secondary | ICD-10-CM

## 2016-09-10 DIAGNOSIS — N6310 Unspecified lump in the right breast, unspecified quadrant: Secondary | ICD-10-CM

## 2016-09-10 DIAGNOSIS — N6325 Unspecified lump in the left breast, overlapping quadrants: Secondary | ICD-10-CM

## 2016-09-15 ENCOUNTER — Ambulatory Visit
Admission: RE | Admit: 2016-09-15 | Discharge: 2016-09-15 | Disposition: A | Discharge status: Home / Self Care | Payer: BLUE CROSS/BLUE SHIELD | Source: Ambulatory Visit | Attending: Nurse Practitioner | Admitting: Nurse Practitioner

## 2016-09-15 DIAGNOSIS — N6325 Unspecified lump in the left breast, overlapping quadrants: Secondary | ICD-10-CM

## 2016-09-15 DIAGNOSIS — N6320 Unspecified lump in the left breast, unspecified quadrant: Principal | ICD-10-CM

## 2016-09-15 DIAGNOSIS — N6323 Unspecified lump in the left breast, lower outer quadrant: Secondary | ICD-10-CM

## 2016-09-15 DIAGNOSIS — N6310 Unspecified lump in the right breast, unspecified quadrant: Secondary | ICD-10-CM

## 2016-09-15 DIAGNOSIS — N6315 Unspecified lump in the right breast, overlapping quadrants: Secondary | ICD-10-CM

## 2016-09-26 ENCOUNTER — Telehealth: Payer: Self-pay

## 2016-09-26 NOTE — Telephone Encounter (Signed)
Pt needs a CD of x-rays ordered by Daub on 09/17/2015 for an upcoming apt this Monday. Please put on a CD and call when ready for pick up!   Thank you! [phone]

## 2016-09-27 ENCOUNTER — Telehealth: Payer: Self-pay

## 2016-09-27 NOTE — Telephone Encounter (Signed)
Spoke with pt and informed her CD ready for pick up

## 2016-09-27 NOTE — Telephone Encounter (Signed)
Pt is checking on status of her request for xrays that was put in yesterday she is hoping to get them for an appt on Monday and be able to pick them up today   Best number [phone]  Pt states is was all the xrays that were taken in October  Of 2016

## 2016-10-02 DIAGNOSIS — M542 Cervicalgia: Secondary | ICD-10-CM | POA: Diagnosis not present

## 2016-10-08 DIAGNOSIS — M542 Cervicalgia: Secondary | ICD-10-CM | POA: Diagnosis not present

## 2016-10-15 DIAGNOSIS — M542 Cervicalgia: Secondary | ICD-10-CM | POA: Diagnosis not present

## 2016-10-21 DIAGNOSIS — L570 Actinic keratosis: Secondary | ICD-10-CM | POA: Diagnosis not present

## 2016-10-22 DIAGNOSIS — M542 Cervicalgia: Secondary | ICD-10-CM | POA: Diagnosis not present

## 2016-11-10 DIAGNOSIS — L821 Other seborrheic keratosis: Secondary | ICD-10-CM | POA: Diagnosis not present

## 2016-11-10 DIAGNOSIS — D2372 Other benign neoplasm of skin of left lower limb, including hip: Secondary | ICD-10-CM | POA: Diagnosis not present

## 2016-11-10 DIAGNOSIS — D225 Melanocytic nevi of trunk: Secondary | ICD-10-CM | POA: Diagnosis not present

## 2016-11-10 DIAGNOSIS — D235 Other benign neoplasm of skin of trunk: Secondary | ICD-10-CM | POA: Diagnosis not present

## 2016-11-10 DIAGNOSIS — L570 Actinic keratosis: Secondary | ICD-10-CM | POA: Diagnosis not present

## 2016-11-13 DIAGNOSIS — N760 Acute vaginitis: Secondary | ICD-10-CM | POA: Diagnosis not present

## 2016-11-13 DIAGNOSIS — R350 Frequency of micturition: Secondary | ICD-10-CM | POA: Diagnosis not present

## 2016-11-13 DIAGNOSIS — N390 Urinary tract infection, site not specified: Secondary | ICD-10-CM | POA: Diagnosis not present

## 2016-12-04 DIAGNOSIS — F410 Panic disorder [episodic paroxysmal anxiety] without agoraphobia: Secondary | ICD-10-CM | POA: Diagnosis not present

## 2016-12-04 DIAGNOSIS — F411 Generalized anxiety disorder: Secondary | ICD-10-CM | POA: Diagnosis not present

## 2016-12-04 DIAGNOSIS — F331 Major depressive disorder, recurrent, moderate: Secondary | ICD-10-CM | POA: Diagnosis not present

## 2016-12-12 DIAGNOSIS — Z01419 Encounter for gynecological examination (general) (routine) without abnormal findings: Secondary | ICD-10-CM | POA: Diagnosis not present

## 2016-12-12 DIAGNOSIS — Z6835 Body mass index (BMI) 35.0-35.9, adult: Secondary | ICD-10-CM | POA: Diagnosis not present

## 2016-12-17 DIAGNOSIS — M25561 Pain in right knee: Secondary | ICD-10-CM | POA: Diagnosis not present

## 2016-12-20 ENCOUNTER — Ambulatory Visit (INDEPENDENT_AMBULATORY_CARE_PROVIDER_SITE_OTHER): Payer: BLUE CROSS/BLUE SHIELD | Admitting: Family Medicine

## 2016-12-20 ENCOUNTER — Ambulatory Visit: Payer: BLUE CROSS/BLUE SHIELD

## 2016-12-20 VITALS — BP 110/80 | HR 87 | Temp 97.6°F | Resp 18 | Ht 68.0 in | Wt 234.5 lb

## 2016-12-20 DIAGNOSIS — E559 Vitamin D deficiency, unspecified: Secondary | ICD-10-CM

## 2016-12-20 DIAGNOSIS — Z0000 Encounter for general adult medical examination without abnormal findings: Secondary | ICD-10-CM

## 2016-12-20 DIAGNOSIS — Z1389 Encounter for screening for other disorder: Secondary | ICD-10-CM

## 2016-12-20 DIAGNOSIS — L282 Other prurigo: Secondary | ICD-10-CM

## 2016-12-20 DIAGNOSIS — L659 Nonscarring hair loss, unspecified: Secondary | ICD-10-CM | POA: Diagnosis not present

## 2016-12-20 DIAGNOSIS — Z130 Encounter for screening for diseases of the blood and blood-forming organs and certain disorders involving the immune mechanism: Secondary | ICD-10-CM | POA: Diagnosis not present

## 2016-12-20 DIAGNOSIS — Z136 Encounter for screening for cardiovascular disorders: Secondary | ICD-10-CM | POA: Diagnosis not present

## 2016-12-20 DIAGNOSIS — R5383 Other fatigue: Secondary | ICD-10-CM

## 2016-12-20 DIAGNOSIS — Z1383 Encounter for screening for respiratory disorder NEC: Secondary | ICD-10-CM | POA: Diagnosis not present

## 2016-12-20 DIAGNOSIS — R079 Chest pain, unspecified: Secondary | ICD-10-CM

## 2016-12-20 DIAGNOSIS — L509 Urticaria, unspecified: Secondary | ICD-10-CM

## 2016-12-20 DIAGNOSIS — Z1329 Encounter for screening for other suspected endocrine disorder: Secondary | ICD-10-CM

## 2016-12-20 DIAGNOSIS — K219 Gastro-esophageal reflux disease without esophagitis: Secondary | ICD-10-CM | POA: Diagnosis not present

## 2016-12-20 LAB — POCT URINALYSIS DIP (MANUAL ENTRY)
BILIRUBIN UA: NEGATIVE
Bilirubin, UA: NEGATIVE
Glucose, UA: NEGATIVE
Leukocytes, UA: NEGATIVE
Nitrite, UA: NEGATIVE
PROTEIN UA: NEGATIVE
RBC UA: NEGATIVE
SPEC GRAV UA: 1.015
UROBILINOGEN UA: 0.2
pH, UA: 5.5

## 2016-12-20 MED ORDER — CETIRIZINE HCL 10 MG PO TABS: 10.0000 mg | ORAL_TABLET | Freq: Every day | ORAL | 11 refills | Status: DC

## 2016-12-20 MED ORDER — RANITIDINE HCL 150 MG PO TABS: 150.0000 mg | ORAL_TABLET | Freq: Two times a day (BID) | ORAL | 1 refills | Status: DC | PRN

## 2016-12-20 MED ORDER — CETIRIZINE HCL 10 MG PO TABS: 10.0000 mg | ORAL_TABLET | Freq: Every day | ORAL | 3 refills | Status: DC

## 2016-12-20 NOTE — Patient Instructions (Signed)
Good job with the weight loss!  Keep it up!  Depending on your [employer] plan, nutrition services may be covered for just the cost of a copay or even free. Check with your insurance to learn more about your coverage or the nutritionist can help you do this. A local nutritionist is [employer] - I think she runs it out of her house - but it very pro-food and helps you figure out how to enjoy the things you like most while cutting corners where you don't need so you don't have to diet but can live a healthy lifestyle for your body.  Check out her website:  [employer] [place] [address]  [place], [place] [address]  [phone]  Health Maintenance, Female Introduction Adopting a healthy lifestyle and getting preventive care can go a long way to promote health and wellness. Talk with your health care provider about what schedule of regular examinations is right for you. This is a good chance for you to check in with your provider about disease prevention and staying healthy. In between checkups, there are plenty of things you can do on your own. Experts have done a lot of research about which lifestyle changes and preventive measures are most likely to keep you healthy. Ask your health care provider for more information. Weight and diet Eat a healthy diet  Be sure to include plenty of vegetables, fruits, low-fat dairy products, and lean protein.  Do not eat a lot of foods high in solid fats, added sugars, or salt.  Get regular exercise. This is one of the most important things you can do for your health.  Most adults should exercise for at least 150 minutes each week. The exercise should increase your heart rate and make you sweat (moderate-intensity exercise).  Most adults should also do strengthening exercises at least twice a week. This is in addition to the moderate-intensity exercise. Maintain a healthy weight  Body mass index (BMI) is a measurement that can be used to  identify possible weight problems. It estimates body fat based on height and weight. Your health care provider can help determine your BMI and help you achieve or maintain a healthy weight.  For females 19 years of age and older:  A BMI below 18.5 is considered underweight.  A BMI of 18.5 to 24.9 is normal.  A BMI of 25 to 29.9 is considered overweight.  A BMI of 30 and above is considered obese. Watch levels of cholesterol and blood lipids  You should start having your blood tested for lipids and cholesterol at 49 years of age, then have this test every 5 years.  You may need to have your cholesterol levels checked more often if:  Your lipid or cholesterol levels are high.  You are older than 49 years of age.  You are at high risk for heart disease. Cancer screening Lung Cancer  Lung cancer screening is recommended for adults 65-26 years old who are at high risk for lung cancer because of a history of smoking.  A yearly low-dose CT scan of the lungs is recommended for people who:  Currently smoke.  Have quit within the past 15 years.  Have at least a 30-pack-year history of smoking. A pack year is smoking an average of one pack of cigarettes a day for 1 year.  Yearly screening should continue until it has been 15 years since you quit.  Yearly screening should stop if you develop a health problem that would prevent  you from having lung cancer treatment. Breast Cancer  Practice breast self-awareness. This means understanding how your breasts normally appear and feel.  It also means doing regular breast self-exams. Let your health care provider know about any changes, no matter how small.  If you are in your 20s or 30s, you should have a clinical breast exam (CBE) by a health care provider every 1-3 years as part of a regular health exam.  If you are 45 or older, have a CBE every year. Also consider having a breast X-ray (mammogram) every year.  If you have a family  history of breast cancer, talk to your health care provider about genetic screening.  If you are at high risk for breast cancer, talk to your health care provider about having an MRI and a mammogram every year.  Breast cancer gene (BRCA) assessment is recommended for women who have family members with BRCA-related cancers. BRCA-related cancers include:  Breast.  Ovarian.  Tubal.  Peritoneal cancers.  Results of the assessment will determine the need for genetic counseling and BRCA1 and BRCA2 testing. Cervical Cancer  Your health care provider may recommend that you be screened regularly for cancer of the pelvic organs (ovaries, uterus, and vagina). This screening involves a pelvic examination, including checking for microscopic changes to the surface of your cervix (Pap test). You may be encouraged to have this screening done every 3 years, beginning at age 36.  For women ages 61-65, health care providers may recommend pelvic exams and Pap testing every 3 years, or they may recommend the Pap and pelvic exam, combined with testing for human papilloma virus (HPV), every 5 years. Some types of HPV increase your risk of cervical cancer. Testing for HPV may also be done on women of any age with unclear Pap test results.  Other health care providers may not recommend any screening for nonpregnant women who are considered low risk for pelvic cancer and who do not have symptoms. Ask your health care provider if a screening pelvic exam is right for you.  If you have had past treatment for cervical cancer or a condition that could lead to cancer, you need Pap tests and screening for cancer for at least 20 years after your treatment. If Pap tests have been discontinued, your risk factors (such as having a new sexual partner) need to be reassessed to determine if screening should resume. Some women have medical problems that increase the chance of getting cervical cancer. In these cases, your health care  provider may recommend more frequent screening and Pap tests. Colorectal Cancer  This type of cancer can be detected and often prevented.  Routine colorectal cancer screening usually begins at 49 years of age and continues through 49 years of age.  Your health care provider may recommend screening at an earlier age if you have risk factors for colon cancer.  Your health care provider may also recommend using home test kits to check for hidden blood in the stool.  A small camera at the end of a tube can be used to examine your colon directly (sigmoidoscopy or colonoscopy). This is done to check for the earliest forms of colorectal cancer.  Routine screening usually begins at age 32.  Direct examination of the colon should be repeated every 5-10 years through 49 years of age. However, you may need to be screened more often if early forms of precancerous polyps or small growths are found. Skin Cancer  Check your skin from head to  toe regularly.  Tell your health care provider about any new moles or changes in moles, especially if there is a change in a mole's shape or color.  Also tell your health care provider if you have a mole that is larger than the size of a pencil eraser.  Always use sunscreen. Apply sunscreen liberally and repeatedly throughout the day.  Protect yourself by wearing long sleeves, pants, a wide-brimmed hat, and sunglasses whenever you are outside. Heart disease, diabetes, and high blood pressure  High blood pressure causes heart disease and increases the risk of stroke. High blood pressure is more likely to develop in:  People who have blood pressure in the high end of the normal range (130-139/85-89 mm Hg).  People who are overweight or obese.  People who are African American.  If you are 22-65 years of age, have your blood pressure checked every 3-5 years. If you are 60 years of age or older, have your blood pressure checked every year. You should have your  blood pressure measured twice-once when you are at a hospital or clinic, and once when you are not at a hospital or clinic. Record the average of the two measurements. To check your blood pressure when you are not at a hospital or clinic, you can use:  An automated blood pressure machine at a pharmacy.  A home blood pressure monitor.  If you are between 10 years and 61 years old, ask your health care provider if you should take aspirin to prevent strokes.  Have regular diabetes screenings. This involves taking a blood sample to check your fasting blood sugar level.  If you are at a normal weight and have a low risk for diabetes, have this test once every three years after 49 years of age.  If you are overweight and have a high risk for diabetes, consider being tested at a younger age or more often. Preventing infection Hepatitis B  If you have a higher risk for hepatitis B, you should be screened for this virus. You are considered at high risk for hepatitis B if:  You were born in a country where hepatitis B is common. Ask your health care provider which countries are considered high risk.  Your parents were born in a high-risk country, and you have not been immunized against hepatitis B (hepatitis B vaccine).  You have HIV or AIDS.  You use needles to inject street drugs.  You live with someone who has hepatitis B.  You have had sex with someone who has hepatitis B.  You get hemodialysis treatment.  You take certain medicines for conditions, including cancer, organ transplantation, and autoimmune conditions. Hepatitis C  Blood testing is recommended for:  Everyone born from [birth year] through 1963.  Anyone with known risk factors for hepatitis C. Sexually transmitted infections (STIs)  You should be screened for sexually transmitted infections (STIs) including gonorrhea and chlamydia if:  You are sexually active and are younger than 49 years of age.  You are older than 49  years of age and your health care provider tells you that you are at risk for this type of infection.  Your sexual activity has changed since you were last screened and you are at an increased risk for chlamydia or gonorrhea. Ask your health care provider if you are at risk.  If you do not have HIV, but are at risk, it may be recommended that you take a prescription medicine daily to prevent HIV infection. This is called  pre-exposure prophylaxis (PrEP). You are considered at risk if:  You are sexually active and do not regularly use condoms or know the HIV status of your partner(s).  You take drugs by injection.  You are sexually active with a partner who has HIV. Talk with your health care provider about whether you are at high risk of being infected with HIV. If you choose to begin PrEP, you should first be tested for HIV. You should then be tested every 3 months for as long as you are taking PrEP. Pregnancy  If you are premenopausal and you may become pregnant, ask your health care provider about preconception counseling.  If you may become pregnant, take 400 to 800 micrograms (mcg) of folic acid every day.  If you want to prevent pregnancy, talk to your health care provider about birth control (contraception). Osteoporosis and menopause  Osteoporosis is a disease in which the bones lose minerals and strength with aging. This can result in serious bone fractures. Your risk for osteoporosis can be identified using a bone density scan.  If you are 58 years of age or older, or if you are at risk for osteoporosis and fractures, ask your health care provider if you should be screened.  Ask your health care provider whether you should take a calcium or vitamin D supplement to lower your risk for osteoporosis.  Menopause may have certain physical symptoms and risks.  Hormone replacement therapy may reduce some of these symptoms and risks. Talk to your health care provider about whether  hormone replacement therapy is right for you. Follow these instructions at home:  Schedule regular health, dental, and eye exams.  Stay current with your immunizations.  Do not use any tobacco products including cigarettes, chewing tobacco, or electronic cigarettes.  If you are pregnant, do not drink alcohol.  If you are breastfeeding, limit how much and how often you drink alcohol.  Limit alcohol intake to no more than 1 drink per day for nonpregnant women. One drink equals 12 ounces of beer, 5 ounces of wine, or 1 ounces of hard liquor.  Do not use street drugs.  Do not share needles.  Ask your health care provider for help if you need support or information about quitting drugs.  Tell your health care provider if you often feel depressed.  Tell your health care provider if you have ever been abused or do not feel safe at home. This information is not intended to replace advice given to you by your health care provider. Make sure you discuss any questions you have with your health care provider. Document Released: 06/09/2011 Document Revised: 05/01/2016 Document Reviewed: 08/28/2015  2017 [employer]

## 2016-12-20 NOTE — Progress Notes (Signed)
Subjective:  By signing my name below, I, [name], attest that this documentation has been prepared under the direction and in the presence of Mary Cheadle, MD. Electronically Signed: [name], [name]. 12/20/2016 , 5:13 PM .  Patient was seen in Room 8 .   Patient ID: JESELLE [name], female    DOB: [birth year], 51 y.o.   MRN: [id number] Chief Complaint  Patient presents with  . Annual Exam    & thyroid issues    HPI [name] is a 51 y.o. female who presents to [facility] for annual physical. Her last physical was 3 years ago, and she was followed by [name]. She has high BP, initially made worse after oral contraceptives. She also had generalized anxiety and depression, did well on high dose Celexa. 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 Her mother passed away in 08-14-2016. She isn't fasting today at this time. After her insurance changed, they couldn't get urinalysis and [name] work authorized at [facility].   Anxiety She is on buspirone 85m qd and Celexa 60mg  qd. She's been feeling overwhelmed with her fiancee needing dialysis, then rushing to take her kids to school, and then work. She's followed by Dr. [name].   She has some chest tightness and occasional chest pain. She believes it's due to anxiety. She initially chest pains since her mother passed away. She would notice it when she's sitting and thinking. She denies having this chest tightness when exercising.   Right knee arthritis She's had chronic right knee pain after ACL replacement in 03-14-10. She recently had orthopedic appointment and had 20cc of liquid pulled from her right knee, and cortisone injection. However, it only gave her relief for about half a day. When she extends her right leg, it feels like there's still fluid behind her knee. Her orthopedist is Dr. [name]. She had xray results on her phone and it showed arthritis in back of knee medially.   Thyroid She's noticed increased hair  loss in the past 2 months. She would have "globs of hair" after washing her hair and in her hair brush after brushing. She also noticed skin changes with small whelps over her body.   Cholesterol She denies history of high cholesterol. She has biometrics at work, and she recalls it was at 185, always under 200.   OBYGN She was followed by Dr. [name] and now followed by [name]. Her periods have been normal.   Immunizations She received flu shot this year.  Her last tetanus shot was received before her son was born, in 03/14/12.   Immunization History  Administered Date(s) Administered  . Hepatitis B 12/08/2000  . Influenza-Unspecified 09/07/2013, 10/08/2016  . Tdap 11/24/2012    Supplements She takes multi-vitamin daily.  She takes Vitamin D3 1000mg  bid.  She denies taking calcium because she enjoys eating cheese and drinking milk.   Family history Her grandfather had heart attack in his 41s.  Her father had high BP.   Past Medical History:  Diagnosis Date  . Anxiety    Adjustment disorder with mixed anxiety and depressed mood   . Arthritis   . Depression    Adjustment disorder with mixed anxiety and depressed mood    Prior to Admission medications   Medication Sig Start Date End Date Taking? Authorizing Provider  busPIRone (BUSPAR) 10 MG tablet Take 10 mg by mouth daily.   Yes Historical Provider, MD  citalopram (CELEXA) 40 MG tablet  60 mg daily.    Yes Historical Provider, MD  clonazePAM (KLONOPIN) 1 MG tablet Take 2 mg by mouth at bedtime.    Yes Historical Provider, MD  etonogestrel-ethinyl estradiol (NUVARING) 0.12-0.015 MG/24HR vaginal ring Place 1 each vaginally every 28 (twenty-eight) days. Insert vaginally and leave in place for 3 consecutive weeks, then remove for 1 week.   Yes Historical Provider, MD  ibuprofen (ADVIL,MOTRIN) 400 MG tablet Take 400 mg by mouth every 6 (six) hours as needed.   Yes Historical Provider, MD  lisinopril (PRINIVIL,ZESTRIL) 10 MG tablet  Take 1 tablet (10 mg total) by mouth daily. 04/26/16  Yes Robyn Haber, MD  Naproxen-Esomeprazole (VIMOVO PO) Take 20 mg by mouth daily as needed.   Yes Historical Provider, MD  omeprazole (PRILOSEC) 20 MG capsule TAKE 1 CAPSULE (20 MG TOTAL) BY MOUTH DAILY AS NEEDED. 04/26/16  Yes Robyn Haber, MD  valACYclovir (VALTREX) 1000 MG tablet Take 1 tablet (1,000 mg total) by mouth 3 (three) times daily. 01/14/16  Yes Chelle Jeffery, PA-C  cyclobenzaprine (FLEXERIL) 10 MG tablet Take 1 tablet at night Patient not taking: Reported on 12/20/2016 09/17/15   Darlyne Russian, MD  diazepam (VALIUM) 10 MG tablet Take 1 tablet (10 mg total) by mouth once. Patient not taking: Reported on 12/20/2016 10/03/15   Marybelle Killings, MD  gabapentin (NEURONTIN) 300 MG capsule Take 1 capsule (300 mg total) by mouth at bedtime. Patient not taking: Reported on 12/20/2016 01/10/16   Robyn Haber, MD   Allergies  Allergen Reactions  . Tape Hives and Itching  . Percocet [Oxycodone-Acetaminophen] Itching   Past Surgical History:  Procedure Laterality Date  . ACL replacement    . CESAREAN SECTION    . CHOLECYSTECTOMY     Family History  Problem Relation Age of Onset  . Diabetes Mother   . Rheum arthritis Mother   . Cancer Father     esophageal  . Cataracts Father   . Lupus Sister   . Allergies Son   . Anxiety disorder Son   . Cancer Maternal Grandmother     breast  . Heart attack Maternal Grandfather   . Cancer Paternal Grandmother     pancreatic  . Heart disease Paternal Grandmother   . Heart attack Paternal Grandfather    Social History   Social History  . Marital status: Divorced    Spouse name: N/A  . Number of children: N/A  . Years of education: N/A   Occupational History  . Manager [employer]   Social History Main Topics  . Smoking status: Never Smoker  . Smokeless tobacco: Never Used  . Alcohol use 0.0 oz/week     Comment: social 1 drink  . Drug use: No  . Sexual activity: Yes    Other Topics Concern  . None   Social History Narrative   Divorced. Education: [employer]. Pt does exercise.    Review of Systems  Constitutional: Positive for activity change.  Respiratory: Positive for chest tightness.   Musculoskeletal: Positive for arthralgias and joint swelling.  Skin: Positive for rash.  Allergic/Immunologic: Positive for environmental allergies.  Psychiatric/Behavioral: The patient is nervous/anxious.   All other systems reviewed and are negative.      Objective:   Physical Exam  Constitutional: She is oriented to person, place, and time. She appears well-developed and well-nourished. No distress.  HENT:  Head: Normocephalic and atraumatic.  Right Ear: Tympanic membrane, external ear and ear canal normal.  Left Ear: Tympanic membrane,  external ear and ear canal normal.  Nose: Nose normal. No mucosal edema or rhinorrhea.  Mouth/Throat: Uvula is midline, oropharynx is clear and moist and mucous membranes are normal. No posterior oropharyngeal erythema.  Eyes: Conjunctivae and EOM are normal. Pupils are equal, round, and reactive to light. Right eye exhibits no discharge. Left eye exhibits no discharge. No scleral icterus.  Neck: Normal range of motion. Neck supple. No thyromegaly present.  Cardiovascular: Normal rate, regular rhythm, normal heart sounds and intact distal pulses.   Pulmonary/Chest: Effort normal and breath sounds normal. No respiratory distress.  Abdominal: Soft. Bowel sounds are normal. There is no tenderness.  Musculoskeletal: Normal range of motion. She exhibits no edema.  Lymphadenopathy:    She has no cervical adenopathy.  Neurological: She is alert and oriented to person, place, and time.  Skin: Skin is warm and dry. She is not diaphoretic. No erythema.  Skin: few small skin colored papules, lightly excoriated on upper extremities, and upper back with handful of excoriated scab papules spread over  Psychiatric: She has a normal mood  and affect. Her behavior is normal.  Nursing note and vitals reviewed.   BP 110/80   Pulse 87   Temp 97.6 F (36.4 C) (Oral)   Resp 18   Ht 5\' 8"  (1.727 m)   Wt 234 lb 8 oz (106.4 kg)   SpO2 98%   BMI 35.66 kg/m    EKG: NSR, no ischemic changes     Assessment & Plan:   1. Annual physical exam - follows closely with gyn for well-woman care - saw sev wks ago  2. Screening for cardiovascular, respiratory, and genitourinary diseases   3. Screening for deficiency anemia   4. Screening for thyroid disorder   5. Vitamin D deficiency - on 1000u bid per gyn, low levels prior.  Pt not getting sig calcium in her diet, does take a mvi, she will try drinking more skim milk daily  6. Hair loss - r/o thyroid as etiology. I do think stress and anxiety could be another factor tying together her hair loss, urticaria, and pruritic rash (or more just anxiety-triggered picking)  7. Other fatigue   8. Urticaria - start trial of cetirizine qhs to see if will prevent. If it does not, consider allergy eval.  Call if wants to do trial of prn hydroxyzine instead of otc benadryl for itching, urticaria, and anxiety  9. Pruritic rash   10. Left upper Chest pain, unspecified type - due to anxiety, we were able to obtain an EKG today WHILE pt was having the chest pain which was very reassuring.  Pain triggered at rest by thinking about stressful things with no asstd sxs, no exertional component  11.    GERD - pt is now on prn nexium as it is in her vimovo; has been using prn omeprazole so will try changing to ranitidine in hopes of that helping with  12.    Anxiety - following closely with Dr. [name] and doing well 13.    Right knee effusion and instability - follows closely with Dr. Latanya Maudlin. Encouraged pt to try 1 mo of scheduled nsaids (vimovo) and reg ice. Start swimming for exercise or bike to strengthen quads. 14.     HTN - on lisinopril 10, bp has been excellent with recent weight loss and she is motivated  to cont on this trend so ok to try to go off and monitor bp at home - might increase some with the  nsaids, call for new rx if she needs to restart with sbp 130s-140  Orders Placed This Encounter  Procedures  . TSH  . CBC  . Comprehensive metabolic panel  . VITAMIN D 25 Hydroxy (Vit-D Deficiency, Fractures)  . Vitamin B12  . POCT urinalysis dipstick  . EKG 12-Lead    Meds ordered this encounter  Medications  . Naproxen-Esomeprazole (VIMOVO PO)    Sig: Take 20 mg by mouth daily as needed.  . busPIRone (BUSPAR) 10 MG tablet    Sig: Take 10 mg by mouth daily.  . cetirizine (ZYRTEC) 10 MG tablet    Sig: Take 1 tablet (10 mg total) by mouth at bedtime.    Dispense:  30 tablet    Refill:  11    I personally performed the services described in this documentation, which was scribed in my presence. The recorded information has been reviewed and considered, and addended by me as needed.   [name], M.D.  [facility] [address] [place], [place] [address] [phone] phone [phone] fax  12/20/16 6:46 PM

## 2016-12-20 NOTE — Progress Notes (Signed)
     IF you received an x-ray today, you will receive an invoice from [facility]. Please contact [facility] at [phone] with questions or concerns regarding your invoice.   IF you received labwork today, you will receive an invoice from [facility]. Please contact [facility] at [phone] with questions or concerns regarding your invoice.   Our billing staff will not be able to assist you with questions regarding bills from these companies.  You will be contacted with the lab results as soon as they are available. The fastest way to get your results is to activate your My Chart account. Instructions are located on the last page of this paperwork. If you have not heard from us regarding the results in 2 weeks, please contact this office.     

## 2016-12-23 LAB — CBC
HEMOGLOBIN: 14.2 g/dL (ref 11.1–15.9)
Hematocrit: 41.4 % (ref 34.0–46.6)
MCH: 28.6 pg (ref 26.6–33.0)
MCHC: 34.3 g/dL (ref 31.5–35.7)
MCV: 83 fL (ref 79–97)
PLATELETS: 198 10*3/uL (ref 150–379)
RBC: 4.97 x10E6/uL (ref 3.77–5.28)
RDW: 13.8 % (ref 12.3–15.4)
WBC: 7.3 10*3/uL (ref 3.4–10.8)

## 2016-12-23 LAB — VITAMIN B12: Vitamin B-12: 471 pg/mL (ref 232–1245)

## 2016-12-23 LAB — COMPREHENSIVE METABOLIC PANEL
A/G RATIO: 1.8 (ref 1.2–2.2)
ALBUMIN: 4.2 g/dL (ref 3.5–5.5)
ALK PHOS: 47 IU/L (ref 39–117)
ALT: 13 IU/L (ref 0–32)
AST: 9 IU/L (ref 0–40)
BUN / CREAT RATIO: 19 (ref 9–23)
BUN: 16 mg/dL (ref 6–24)
CHLORIDE: 102 mmol/L (ref 96–106)
CO2: 18 mmol/L (ref 18–29)
Calcium: 8.8 mg/dL (ref 8.7–10.2)
Creatinine, Ser: 0.85 mg/dL (ref 0.57–1.00)
GFR calc non Af Amer: 81 mL/min/{1.73_m2} (ref >59)
GFR, EST AFRICAN AMERICAN: 94 mL/min/{1.73_m2} (ref >59)
GLUCOSE: 87 mg/dL (ref 65–99)
Globulin, Total: 2.4 g/dL (ref 1.5–4.5)
POTASSIUM: 4.2 mmol/L (ref 3.5–5.2)
SODIUM: 141 mmol/L (ref 134–144)
TOTAL PROTEIN: 6.6 g/dL (ref 6.0–8.5)

## 2016-12-23 LAB — VITAMIN D 25 HYDROXY (VIT D DEFICIENCY, FRACTURES): VIT D 25 HYDROXY: 37 ng/mL (ref 30.0–100.0)

## 2016-12-23 LAB — TSH: TSH: 1.9 u[IU]/mL (ref 0.450–4.500)

## 2016-12-27 ENCOUNTER — Other Ambulatory Visit: Payer: Self-pay

## 2016-12-27 MED ORDER — RANITIDINE HCL 150 MG PO TABS: 150.0000 mg | ORAL_TABLET | Freq: Two times a day (BID) | ORAL | 1 refills | Status: DC | PRN

## 2016-12-27 NOTE — Telephone Encounter (Signed)
[facility] fax req for 90 days ranitidine [facility]net

## 2017-01-09 DIAGNOSIS — S83206A Unspecified tear of unspecified meniscus, current injury, right knee, initial encounter: Secondary | ICD-10-CM | POA: Diagnosis not present

## 2017-01-16 DIAGNOSIS — M25561 Pain in right knee: Secondary | ICD-10-CM | POA: Diagnosis not present

## 2017-01-19 DIAGNOSIS — M1711 Unilateral primary osteoarthritis, right knee: Secondary | ICD-10-CM | POA: Diagnosis not present

## 2017-01-24 DIAGNOSIS — M1711 Unilateral primary osteoarthritis, right knee: Secondary | ICD-10-CM | POA: Diagnosis not present

## 2017-04-01 DIAGNOSIS — F411 Generalized anxiety disorder: Secondary | ICD-10-CM | POA: Diagnosis not present

## 2017-04-01 DIAGNOSIS — F410 Panic disorder [episodic paroxysmal anxiety] without agoraphobia: Secondary | ICD-10-CM | POA: Diagnosis not present

## 2017-04-01 DIAGNOSIS — F331 Major depressive disorder, recurrent, moderate: Secondary | ICD-10-CM | POA: Diagnosis not present

## 2017-06-11 ENCOUNTER — Other Ambulatory Visit: Payer: Self-pay | Admitting: Nurse Practitioner

## 2017-06-11 DIAGNOSIS — N644 Mastodynia: Secondary | ICD-10-CM

## 2017-06-11 DIAGNOSIS — R922 Inconclusive mammogram: Secondary | ICD-10-CM

## 2017-06-17 ENCOUNTER — Ambulatory Visit
Admission: RE | Admit: 2017-06-17 | Discharge: 2017-06-17 | Disposition: A | Discharge status: Home / Self Care | Payer: BLUE CROSS/BLUE SHIELD | Source: Ambulatory Visit | Attending: Nurse Practitioner | Admitting: Nurse Practitioner

## 2017-06-17 ENCOUNTER — Other Ambulatory Visit: Payer: Self-pay | Admitting: Nurse Practitioner

## 2017-06-17 DIAGNOSIS — R922 Inconclusive mammogram: Secondary | ICD-10-CM

## 2017-06-17 DIAGNOSIS — N6311 Unspecified lump in the right breast, upper outer quadrant: Secondary | ICD-10-CM | POA: Diagnosis not present

## 2017-06-17 DIAGNOSIS — N644 Mastodynia: Secondary | ICD-10-CM

## 2017-06-17 DIAGNOSIS — R928 Other abnormal and inconclusive findings on diagnostic imaging of breast: Secondary | ICD-10-CM | POA: Diagnosis not present

## 2017-06-23 ENCOUNTER — Other Ambulatory Visit: Payer: Self-pay | Admitting: Nurse Practitioner

## 2017-06-23 ENCOUNTER — Other Ambulatory Visit: Payer: Self-pay | Admitting: *Deleted

## 2017-06-23 DIAGNOSIS — I10 Essential (primary) hypertension: Secondary | ICD-10-CM

## 2017-06-23 DIAGNOSIS — N63 Unspecified lump in unspecified breast: Secondary | ICD-10-CM

## 2017-06-23 MED ORDER — LISINOPRIL 10 MG PO TABS: 10.0000 mg | ORAL_TABLET | Freq: Every day | ORAL | 11 refills | Status: DC

## 2017-06-24 DIAGNOSIS — F331 Major depressive disorder, recurrent, moderate: Secondary | ICD-10-CM | POA: Diagnosis not present

## 2017-06-24 DIAGNOSIS — F411 Generalized anxiety disorder: Secondary | ICD-10-CM | POA: Diagnosis not present

## 2017-06-24 DIAGNOSIS — F410 Panic disorder [episodic paroxysmal anxiety] without agoraphobia: Secondary | ICD-10-CM | POA: Diagnosis not present

## 2017-09-15 DIAGNOSIS — D2361 Other benign neoplasm of skin of right upper limb, including shoulder: Secondary | ICD-10-CM | POA: Diagnosis not present

## 2017-09-15 DIAGNOSIS — D2372 Other benign neoplasm of skin of left lower limb, including hip: Secondary | ICD-10-CM | POA: Diagnosis not present

## 2017-09-15 DIAGNOSIS — L821 Other seborrheic keratosis: Secondary | ICD-10-CM | POA: Diagnosis not present

## 2017-09-15 DIAGNOSIS — L573 Poikiloderma of Civatte: Secondary | ICD-10-CM | POA: Diagnosis not present

## 2017-09-16 DIAGNOSIS — F411 Generalized anxiety disorder: Secondary | ICD-10-CM | POA: Diagnosis not present

## 2017-09-16 DIAGNOSIS — F331 Major depressive disorder, recurrent, moderate: Secondary | ICD-10-CM | POA: Diagnosis not present

## 2017-09-16 DIAGNOSIS — F410 Panic disorder [episodic paroxysmal anxiety] without agoraphobia: Secondary | ICD-10-CM | POA: Diagnosis not present

## 2017-09-17 ENCOUNTER — Other Ambulatory Visit: Payer: BLUE CROSS/BLUE SHIELD

## 2017-09-24 ENCOUNTER — Ambulatory Visit: Payer: BLUE CROSS/BLUE SHIELD

## 2017-09-24 ENCOUNTER — Ambulatory Visit
Admission: RE | Admit: 2017-09-24 | Discharge: 2017-09-24 | Disposition: A | Discharge status: Home / Self Care | Payer: BLUE CROSS/BLUE SHIELD | Source: Ambulatory Visit | Attending: Nurse Practitioner | Admitting: Nurse Practitioner

## 2017-09-24 DIAGNOSIS — R928 Other abnormal and inconclusive findings on diagnostic imaging of breast: Secondary | ICD-10-CM | POA: Diagnosis not present

## 2017-09-24 DIAGNOSIS — N63 Unspecified lump in unspecified breast: Secondary | ICD-10-CM

## 2017-11-23 ENCOUNTER — Other Ambulatory Visit: Payer: Self-pay

## 2017-11-23 ENCOUNTER — Ambulatory Visit: Payer: BLUE CROSS/BLUE SHIELD | Admitting: Physician Assistant

## 2017-11-23 ENCOUNTER — Encounter: Payer: Self-pay | Admitting: Physician Assistant

## 2017-11-23 VITALS — BP 128/62 | HR 92 | Temp 98.5°F | Resp 16 | Ht 68.0 in | Wt 232.0 lb

## 2017-11-23 DIAGNOSIS — R197 Diarrhea, unspecified: Secondary | ICD-10-CM | POA: Diagnosis not present

## 2017-11-23 DIAGNOSIS — R1030 Lower abdominal pain, unspecified: Secondary | ICD-10-CM

## 2017-11-23 LAB — POCT URINALYSIS DIP (MANUAL ENTRY)
BILIRUBIN UA: NEGATIVE
BILIRUBIN UA: NEGATIVE mg/dL
Blood, UA: NEGATIVE
Glucose, UA: NEGATIVE mg/dL
LEUKOCYTES UA: NEGATIVE
Nitrite, UA: NEGATIVE
PH UA: 5.5 (ref 5.0–8.0)
Protein Ur, POC: NEGATIVE mg/dL
Spec Grav, UA: >=1.03 — AB (ref 1.010–1.025)
Urobilinogen, UA: 0.2 E.U./dL

## 2017-11-23 MED ORDER — HYOSCYAMINE SULFATE SL 0.125 MG SL SUBL: 0.1250 mg | SUBLINGUAL_TABLET | Freq: Every day | SUBLINGUAL | 0 refills | Status: DC

## 2017-11-23 MED ORDER — CIPROFLOXACIN HCL 500 MG PO TABS: 500.0000 mg | ORAL_TABLET | Freq: Two times a day (BID) | ORAL | 0 refills | Status: DC

## 2017-11-23 NOTE — Progress Notes (Signed)
PRIMARY CARE AT [facility] [address], [place] [address] [phone]  Date:  11/23/2017   Name:  [name]   DOB:  [birth year]   MRN:  [id number]  PCP:  Shawnee Knapp, MD    History of Present Illness:  [name] is a 51 y.o. female patient who presents to PCP with  Chief Complaint  Patient presents with  . Diarrhea    x 1wk/      She is having diarrhea 5 times per day.  It is brown and watery.  She has some gas.  She has abdominal pain at her lower abdomen.  She has fever.  No fatigue or dizziness.  No recent visits with hospitalizations, shelters, prisons, or nursing homes.  No one in the home are sick.   She has a sore throat since yesterday, but recalls it was very hot food that she ate at a friend, and recalls burning her throat during consumption.    Patient Active Problem List   Diagnosis Date Noted  . Varicose veins   . Varicose veins of left lower extremity   . Adjustment disorder with mixed anxiety and depressed mood 04/20/2014  . Overweight(278.02) 04/20/2014  . Contraception 04/20/2014    Past Medical History:  Diagnosis Date  . Anxiety    Adjustment disorder with mixed anxiety and depressed mood   . Arthritis   . Depression    Adjustment disorder with mixed anxiety and depressed mood     Past Surgical History:  Procedure Laterality Date  . ACL replacement    . BREAST BIOPSY    . BREAST EXCISIONAL BIOPSY Left   . CESAREAN SECTION    . CHOLECYSTECTOMY      Social History   Tobacco Use  . Smoking status: Never Smoker  . Smokeless tobacco: Never Used  Substance Use Topics  . Alcohol use: Yes    Alcohol/week: 0.0 oz    Comment: social 1 drink  . Drug use: No    Family History  Problem Relation Age of Onset  . Diabetes Mother   . Rheum arthritis Mother   . Cancer Father        esophageal  . Cataracts Father   . Lupus Sister   . Allergies Son   . Anxiety disorder Son   . Cancer Maternal Grandmother        breast  . Breast cancer  Maternal Grandmother   . Heart attack Maternal Grandfather   . Cancer Paternal Grandmother        pancreatic  . Heart disease Paternal Grandmother   . Heart attack Paternal Grandfather     Allergies  Allergen Reactions  . Tape Hives and Itching  . Percocet [Oxycodone-Acetaminophen] Itching    Medication list has been reviewed and updated.  Current Outpatient Medications on File Prior to Visit  Medication Sig Dispense Refill  . busPIRone (BUSPAR) 10 MG tablet Take 10 mg by mouth daily.    . cetirizine (ZYRTEC) 10 MG tablet Take 1 tablet (10 mg total) by mouth at bedtime. 90 tablet 3  . citalopram (CELEXA) 40 MG tablet 60 mg daily.     . clonazePAM (KLONOPIN) 1 MG tablet Take 2 mg by mouth at bedtime.     Marland Kitchen etonogestrel-ethinyl estradiol (NUVARING) 0.12-0.015 MG/24HR vaginal ring Place 1 each vaginally every 28 (twenty-eight) days. Insert vaginally and leave in place for 3 consecutive weeks, then remove for 1 week.    Marland Kitchen lisinopril (PRINIVIL,ZESTRIL) 10 MG  tablet Take 1 tablet (10 mg total) by mouth daily. 30 tablet 11  . ranitidine (ZANTAC) 150 MG tablet Take 1-2 tablets (150-300 mg total) by mouth 2 (two) times daily as needed for heartburn. 360 tablet 1  . Naproxen-Esomeprazole (VIMOVO PO) Take 20 mg by mouth daily as needed.     No current facility-administered medications on file prior to visit.     ROS ROS otherwise unremarkable unless listed above.  Physical Examination: BP 128/62   Pulse 92   Temp 98.5 F (36.9 C) (Oral)   Resp 16   Ht 5\' 8"  (1.727 m)   Wt 232 lb (105.2 kg)   SpO2 99%   BMI 35.28 kg/m  Ideal Body Weight: Weight in (lb) to have BMI = 25: 164.1  Physical Exam  Constitutional: She is oriented to person, place, and time. She appears well-developed and well-nourished. No distress.  HENT:  Head: Normocephalic and atraumatic.  Right Ear: External ear normal.  Left Ear: External ear normal.  Eyes: Conjunctivae and EOM are normal. Pupils are equal,  round, and reactive to light.  Cardiovascular: Normal rate.  Pulmonary/Chest: Effort normal. No respiratory distress.  Abdominal: Soft. Normal appearance and bowel sounds are normal. There is generalized tenderness and tenderness in the right lower quadrant, suprapubic area and left lower quadrant.  Neurological: She is alert and oriented to person, place, and time.  Skin: She is not diaphoretic.  Psychiatric: She has a normal mood and affect. Her behavior is normal.     Assessment and Plan: [name] is a 51 y.o. female who is here today for cc of  Chief Complaint  Patient presents with  . Diarrhea    x 1wk/   --gi stool culture obtained today.  Will start on cipro given nature and length of her diarrhea.  Advised appropriate hydration.  Given levsin for abdominal pain.  Follow up if symptoms do not improve within 5 days.  Diarrhea, unspecified type - Plan: Hyoscyamine Sulfate SL (LEVSIN/SL) 0.125 MG SUBL, ciprofloxacin (CIPRO) 500 MG tablet  Lower abdominal pain - Plan: POCT urinalysis dipstick, CBC, GI Profile, Stool, PCR  Ivar Drape, PA-C [facility] 12/26/20189:43 PM

## 2017-11-23 NOTE — Patient Instructions (Addendum)
Food Choices to Help Relieve Diarrhea, Adult When you have diarrhea, the foods you eat and your eating habits are very important. Choosing the right foods and drinks can help:  Relieve diarrhea.  Replace lost fluids and nutrients.  Prevent dehydration.  What general guidelines should I follow? Relieving diarrhea  Choose foods with less than 2 g or .07 oz. of fiber per serving.  Limit fats to less than 8 tsp (38 g or 1.34 oz.) a day.  Avoid the following: ? Foods and beverages sweetened with high-fructose corn syrup, honey, or sugar alcohols such as xylitol, sorbitol, and mannitol. ? Foods that contain a lot of fat or sugar. ? Fried, greasy, or spicy foods. ? High-fiber grains, breads, and cereals. ? Raw fruits and vegetables.  Eat foods that are rich in probiotics. These foods include dairy products such as yogurt and fermented milk products. They help increase healthy bacteria in the stomach and intestines (gastrointestinal tract, or GI tract).  If you have lactose intolerance, avoid dairy products. These may make your diarrhea worse.  Take medicine to help stop diarrhea (antidiarrheal medicine) only as told by your health care provider. Replacing nutrients  Eat small meals or snacks every 3-4 hours.  Eat bland foods, such as white rice, toast, or baked potato, until your diarrhea starts to get better. Gradually reintroduce nutrient-rich foods as tolerated or as told by your health care provider. This includes: ? Well-cooked protein foods. ? Peeled, seeded, and soft-cooked fruits and vegetables. ? Low-fat dairy products.  Take vitamin and mineral supplements as told by your health care provider. Preventing dehydration   Start by sipping water or a special solution to prevent dehydration (oral rehydration solution, ORS). Urine that is clear or pale yellow means that you are getting enough fluid.  Try to drink at least 8-10 cups of fluid each day to help replace lost  fluids.  You may add other liquids in addition to water, such as clear juice or decaffeinated sports drinks, as tolerated or as told by your health care provider.  Avoid drinks with caffeine, such as coffee, tea, or soft drinks.  Avoid alcohol. What foods are recommended? The items listed may not be a complete list. Talk with your health care provider about what dietary choices are best for you. Grains White rice. White, [place], or pita breads (fresh or toasted), including plain rolls, buns, or bagels. White pasta. Saltine, soda, or graham crackers. Pretzels. Low-fiber cereal. Cooked cereals made with water (such as cornmeal, farina, or cream cereals). Plain muffins. Matzo. Melba toast. Zwieback. Vegetables Potatoes (without the skin). Most well-cooked and canned vegetables without skins or seeds. Tender lettuce. Fruits Apple sauce. Fruits canned in juice. Cooked apricots, cherries, grapefruit, peaches, pears, or plums. Fresh bananas and cantaloupe. Meats and other protein foods Baked or boiled chicken. Eggs. Tofu. Fish. Seafood. Smooth nut butters. Ground or well-cooked tender beef, ham, veal, lamb, pork, or poultry. Dairy Plain yogurt, kefir, and unsweetened liquid yogurt. Lactose-free milk, buttermilk, skim milk, or soy milk. Low-fat or nonfat hard cheese. Beverages Water. Low-calorie sports drinks. Fruit juices without pulp. Strained tomato and vegetable juices. Decaffeinated teas. Sugar-free beverages not sweetened with sugar alcohols. Oral rehydration solutions, if approved by your health care provider. Seasoning and other foods Bouillon, broth, or soups made from recommended foods. What foods are not recommended? The items listed may not be a complete list. Talk with your health care provider about what dietary choices are best for you. Grains Whole grain,  whole wheat, bran, or rye breads, rolls, pastas, and crackers. Wild or brown rice. Whole grain or bran cereals. Barley. Oats and  oatmeal. Corn tortillas or taco shells. Granola. Popcorn. Vegetables Raw vegetables. Fried vegetables. Cabbage, broccoli, Brussels sprouts, artichokes, baked beans, beet greens, corn, kale, legumes, peas, sweet potatoes, and yams. Potato skins. Cooked spinach and cabbage. Fruits Dried fruit, including raisins and dates. Raw fruits. Stewed or dried prunes. Canned fruits with syrup. Meat and other protein foods Fried or fatty meats. Deli meats. Chunky nut butters. Nuts and seeds. Beans and lentils. Berniece Salines. Hot dogs. Sausage. Dairy High-fat cheeses. Whole milk, chocolate milk, and beverages made with milk, such as milk shakes. Half-and-half. Cream. sour cream. Ice cream. Beverages Caffeinated beverages (such as coffee, tea, soda, or energy drinks). Alcoholic beverages. Fruit juices with pulp. Prune juice. Soft drinks sweetened with high-fructose corn syrup or sugar alcohols. High-calorie sports drinks. Fats and oils Butter. Cream sauces. Margarine. Salad oils. Plain salad dressings. Olives. Avocados. Mayonnaise. Sweets and desserts Sweet rolls, doughnuts, and sweet breads. Sugar-free desserts sweetened with sugar alcohols such as xylitol and sorbitol. Seasoning and other foods Honey. Hot sauce. Chili powder. Gravy. Cream-based or milk-based soups. Pancakes and waffles. Summary  When you have diarrhea, the foods you eat and your eating habits are very important.  Make sure you get at least 8-10 cups of fluid each day, or enough to keep your urine clear or pale yellow.  Eat bland foods and gradually reintroduce healthy, nutrient-rich foods as tolerated, or as told by your health care provider.  Avoid high-fiber, fried, greasy, or spicy foods. This information is not intended to replace advice given to you by your health care provider. Make sure you discuss any questions you have with your health care provider. Document Released: 02/14/2004 Document Revised: 11/21/2016 Document Reviewed:  11/21/2016 Elsevier Interactive Patient Education  2017 Else[employer] Inc.  Diarrhea, Adult Diarrhea is when you have loose and water poop (stool) often. Diarrhea can make you feel weak and cause you to get dehydrated. Dehydration can make you tired and thirsty, make you have a dry mouth, and make it so you pee (urinate) less often. Diarrhea often lasts 2-3 days. However, it can last longer if it is a sign of something more serious. It is important to treat your diarrhea as told by your doctor. Follow these instructions at home: Eating and drinking  Follow these recommendations as told by your doctor:  Take an oral rehydration solution (ORS). This is a drink that is sold at pharmacies and stores.  Drink clear fluids, such as: ? Water. ? Ice chips. ? Diluted fruit juice. ? Low-calorie sports drinks.  Eat bland, easy-to-digest foods in small amounts as you are able. These foods include: ? Bananas. ? Applesauce. ? Rice. ? Low-fat (lean) meats. ? Toast. ? Crackers.  Avoid drinking fluids that have a lot of sugar or caffeine in them.  Avoid alcohol.  Avoid spicy or fatty foods.  General instructions   Drink enough fluid to keep your pee (urine) clear or pale yellow.  Wash your hands often. If you cannot use soap and water, use hand sanitizer.  Make sure that all people in your home wash their hands well and often.  Take over-the-counter and prescription medicines only as told by your doctor.  Rest at home while you get better.  Watch your condition for any changes.  Take a warm bath to help with any burning or pain from having diarrhea.  Keep all follow-up visits  as told by your doctor. This is important. Contact a doctor if:  You have a fever.  Your diarrhea gets worse.  You have new symptoms.  You cannot keep fluids down.  You feel light-headed or dizzy.  You have a headache.  You have muscle cramps. Get help right away if:  You have chest pain.  You feel  very weak or you pass out (faint).  You have bloody or black poop or poop that look like tar.  You have very bad pain, cramping, or bloating in your belly (abdomen).  You have trouble breathing or you are breathing very quickly.  Your heart is beating very quickly.  Your skin feels cold and clammy.  You feel confused.  You have signs of dehydration, such as: ? Dark pee, hardly any pee, or no pee. ? Cracked lips. ? Dry mouth. ? Sunken eyes. ? Sleepiness. ? Weakness. This information is not intended to replace advice given to you by your health care provider. Make sure you discuss any questions you have with your health care provider. Document Released: 05/12/2008 Document Revised: 06/13/2016 Document Reviewed: 07/31/2015 Elsevier Interactive Patient Education  2018 [employer].   IF you received an x-ray today, you will receive an invoice from [facility]. Please contact [facility] at [phone] with questions or concerns regarding your invoice.   IF you received labwork today, you will receive an invoice from [place]. Please contact [facility] at [phone] with questions or concerns regarding your invoice.   Our billing staff will not be able to assist you with questions regarding bills from these companies.  You will be contacted with the lab results as soon as they are available. The fastest way to get your results is to activate your My Chart account. Instructions are located on the last page of this paperwork. If you have not heard from [place] regarding the results in 2 weeks, please contact this office.     \

## 2017-11-24 LAB — CBC
Hematocrit: 41.8 % (ref 34.0–46.6)
Hemoglobin: 13.5 g/dL (ref 11.1–15.9)
MCH: 27.7 pg (ref 26.6–33.0)
MCHC: 32.3 g/dL (ref 31.5–35.7)
MCV: 86 fL (ref 79–97)
PLATELETS: 282 10*3/uL (ref 150–379)
RBC: 4.87 x10E6/uL (ref 3.77–5.28)
RDW: 13.8 % (ref 12.3–15.4)
WBC: 7.6 10*3/uL (ref 3.4–10.8)

## 2017-11-25 LAB — GI PROFILE, STOOL, PCR
Adenovirus F 40/41: NOT DETECTED
Astrovirus: NOT DETECTED
C difficile toxin A/B: NOT DETECTED
CRYPTOSPORIDIUM: NOT DETECTED
Campylobacter: NOT DETECTED
Cyclospora cayetanensis: NOT DETECTED
ENTEROTOXIGENIC E COLI: NOT DETECTED
Entamoeba histolytica: NOT DETECTED
Enteroaggregative E coli: NOT DETECTED
Enteropathogenic E coli: NOT DETECTED
GIARDIA LAMBLIA: NOT DETECTED
NOROVIRUS GI/GII: NOT DETECTED
PLESIOMONAS SHIGELLOIDES: NOT DETECTED
ROTAVIRUS A: NOT DETECTED
SALMONELLA: NOT DETECTED
SHIGELLA/ENTEROINVASIVE E COLI: NOT DETECTED
Sapovirus: NOT DETECTED
Shiga-toxin-producing E coli: NOT DETECTED
Vibrio cholerae: NOT DETECTED
Vibrio: NOT DETECTED
YERSINIA ENTEROCOLITICA: NOT DETECTED

## 2017-11-30 ENCOUNTER — Telehealth: Payer: Self-pay

## 2017-11-30 NOTE — Telephone Encounter (Signed)
Pt advised on stool culture. She is still having diarrhea. Advised pt that she may need to come back in based on provider recommendation.   Copied from [place] [id number]. Topic: Quick Communication - Lab Results >> Nov 27, 2017  3:02 PM Scherrie Gerlach wrote: Pt would like a call back and if the stool culture has come back. Pt would like results asap

## 2017-12-03 ENCOUNTER — Telehealth: Payer: Self-pay | Admitting: Physician Assistant

## 2017-12-03 NOTE — Telephone Encounter (Signed)
Called pt to remind her of appt with [place] in the morning

## 2017-12-04 ENCOUNTER — Ambulatory Visit: Payer: BLUE CROSS/BLUE SHIELD | Admitting: Physician Assistant

## 2017-12-04 VITALS — BP 138/86 | HR 68 | Temp 98.5°F | Resp 16 | Ht 68.0 in | Wt 232.0 lb

## 2017-12-04 DIAGNOSIS — R197 Diarrhea, unspecified: Secondary | ICD-10-CM | POA: Diagnosis not present

## 2017-12-04 MED ORDER — CIPROFLOXACIN HCL 500 MG PO TABS: 500.0000 mg | ORAL_TABLET | Freq: Two times a day (BID) | ORAL | 0 refills | Status: DC

## 2017-12-04 MED ORDER — METRONIDAZOLE 500 MG PO TABS: 500.0000 mg | ORAL_TABLET | Freq: Three times a day (TID) | ORAL | 0 refills | Status: DC

## 2017-12-04 NOTE — Patient Instructions (Addendum)
IF you received an x-ray today, you will receive an invoice from [facility]. Please contact [facility] at [phone] with questions or concerns regarding your invoice.   IF you received labwork today, you will receive an invoice from [place]. Please contact [facility] at [phone] with questions or concerns regarding your invoice.   Our billing staff will not be able to assist you with questions regarding bills from these companies.  You will be contacted with the lab results as soon as they are available. The fastest way to get your results is to activate your My Chart account. Instructions are located on the last page of this paperwork. If you have not heard from [place] regarding the results in 2 weeks, please contact this office.     Soft-Food Meal Plan A soft-food meal plan includes foods that are safe and easy to swallow. This meal plan typically is used:  If you are having trouble chewing or swallowing foods.  As a transition meal plan after only having had liquid meals for a long period.  What do I need to know about the soft-food meal plan? A soft-food meal plan includes tender foods that are soft and easy to chew and swallow. In most cases, bite-sized pieces of food are easier to swallow. A bite-sized piece is about  inch or smaller. Foods in this plan do not need to be ground or pureed. Foods that are very hard, crunchy, or sticky should be avoided. Also, breads, cereals, yogurts, and desserts with nuts, seeds, or fruits should be avoided. What foods can I eat? Grains Rice and wild rice. Moist bread, dressing, pasta, and noodles. Well-moistened dry or cooked cereals, such as farina (cooked wheat cereal), oatmeal, or grits. Biscuits, breads, muffins, pancakes, and waffles that have been well moistened. Vegetables Shredded lettuce. Cooked, tender vegetables, including potatoes without skins. Vegetable juices. Broths or creamed soups made with vegetables  that are not stringy or chewy. Strained tomatoes (without seeds). Fruits Canned or well-cooked fruits. Soft (ripe), peeled fresh fruits, such as peaches, nectarines, kiwi, cantaloupe, honeydew melon, and watermelon (without seeds). Soft berries with small seeds, such as strawberries. Fruit juices (without pulp). Meats and Other Protein Sources Moist, tender, lean beef. Mutton. Lamb. Veal. Chicken. [place]. Liver. Ham. Fish without bones. Eggs. Dairy Milk, milk drinks, and cream. Plain cream cheese and cottage cheese. Plain yogurt. Sweets/Desserts Flavored gelatin desserts. Custard. Plain ice cream, frozen yogurt, sherbet, milk shakes, and malts. Plain cakes and cookies. Plain hard candy. Other Butter, margarine (without trans fat), and cooking oils. Mayonnaise. Cream sauces. Mild spices, salt, and sugar. Syrup, molasses, honey, and jelly. The items listed above may not be a complete list of recommended foods or beverages. Contact your dietitian for more options. What foods are not recommended? Grains Dry bread, toast, crackers that have not been moistened. Coarse or dry cereals, such as bran, granola, and shredded wheat. Tough or chewy crusty breads, such as [place] bread or baguettes. Vegetables Corn. Raw vegetables except shredded lettuce. Cooked vegetables that are tough or stringy. Tough, crisp, fried potatoes and potato skins. Fruits Fresh fruits with skins or seeds or both, such as apples, pears, or grapes. Stringy, high-pulp fruits, such as papaya, pineapple, coconut, or mango. Fruit leather, fruit roll-ups, and all dried fruits. Meats and Other Protein Sources Sausages and hot dogs. Meats with gristle. Fish with bones. Nuts, seeds, and chunky peanut or other nut butters. Sweets/Desserts Cakes or cookies that are very dry or chewy. The items listed  above may not be a complete list of foods and beverages to avoid. Contact your dietitian for more information. This information is not  intended to replace advice given to you by your health care provider. Make sure you discuss any questions you have with your health care provider. Document Released: 03/02/2008 Document Revised: 05/01/2016 Document Reviewed: 10/21/2013 Elsevier Interactive Patient Education  2017 Elsevier Inc.  Diverticulitis Diverticulitis is when small pockets in your large intestine (colon) get infected or swollen. This causes stomach pain and watery poop (diarrhea). These pouches are called diverticula. They form in people who have a condition called diverticulosis. Follow these instructions at home: Medicines  Take over-the-counter and prescription medicines only as told by your doctor. These include: ? Antibiotics. ? Pain medicines. ? Fiber pills. ? Probiotics. ? Stool softeners.  Do not drive or use heavy machinery while taking prescription pain medicine.  If you were prescribed an antibiotic, take it as told. Do not stop taking it even if you feel better. General instructions  Follow a diet as told by your doctor.  When you feel better, your doctor may tell you to change your diet. You may need to eat a lot of fiber. Fiber makes it easier to poop (have bowel movements). Healthy foods with fiber include: ? Berries. ? Beans. ? Lentils. ? Green vegetables.  Exercise 3 or more times a week. Aim for 30 minutes each time. Exercise enough to sweat and make your heart beat faster.  Keep all follow-up visits as told. This is important. You may need to have an exam of the large intestine. This is called a colonoscopy. Contact a doctor if:  Your pain does not get better.  You have a hard time eating or drinking.  You are not pooping like normal. Get help right away if:  Your pain gets worse.  Your problems do not get better.  Your problems get worse very fast.  You have a fever.  You throw up (vomit) more than one time.  You have poop that  is: ? Bloody. ? Black. ? Tarry. Summary  Diverticulitis is when small pockets in your large intestine (colon) get infected or swollen.  Take medicines only as told by your doctor.  Follow a diet as told by your doctor. This information is not intended to replace advice given to you by your health care provider. Make sure you discuss any questions you have with your health care provider. Document Released: 05/12/2008 Document Revised: 12/11/2016 Document Reviewed: 12/11/2016 Elsevier Interactive Patient Education  2017 [employer].

## 2017-12-05 ENCOUNTER — Encounter: Payer: Self-pay | Admitting: Physician Assistant

## 2017-12-05 LAB — CMP14+EGFR
A/G RATIO: 1.7 (ref 1.2–2.2)
ALBUMIN: 4.2 g/dL (ref 3.5–5.5)
ALT: 14 IU/L (ref 0–32)
AST: 11 IU/L (ref 0–40)
Alkaline Phosphatase: 58 IU/L (ref 39–117)
BUN/Creatinine Ratio: 18 (ref 9–23)
BUN: 14 mg/dL (ref 6–24)
Bilirubin Total: 0.4 mg/dL (ref 0.0–1.2)
CALCIUM: 8.9 mg/dL (ref 8.7–10.2)
CHLORIDE: 106 mmol/L (ref 96–106)
CO2: 21 mmol/L (ref 20–29)
CREATININE: 0.78 mg/dL (ref 0.57–1.00)
GFR, EST AFRICAN AMERICAN: 103 mL/min/{1.73_m2} (ref >59)
GFR, EST NON AFRICAN AMERICAN: 90 mL/min/{1.73_m2} (ref >59)
GLOBULIN, TOTAL: 2.5 g/dL (ref 1.5–4.5)
Glucose: 98 mg/dL (ref 65–99)
Potassium: 3.8 mmol/L (ref 3.5–5.2)
Sodium: 142 mmol/L (ref 134–144)
TOTAL PROTEIN: 6.7 g/dL (ref 6.0–8.5)

## 2017-12-05 LAB — TSH: TSH: 1.84 u[IU]/mL (ref 0.450–4.500)

## 2017-12-05 NOTE — Progress Notes (Signed)
PRIMARY CARE AT [facility] [address], [place] [address] [phone]  Date:  12/04/2017   Name:  [name]   DOB:  18-Mar-1966   MRN:  [id number]  PCP:  Shawnee Knapp, MD    History of Present Illness:  [name] is a 51 y.o. female patient who presents to PCP with  Chief Complaint  Patient presents with  . Follow-up    diarrhea/ pt still not better. stool is still runny     Patient is here for follow up of her diarrhea.  She was seen here about 2 weeks ago.  She continues to have watery diarrhea.  She was seen after 1 week of diarrhea.  Stool culture had no pathogen detected at that time.  She has no abdominal pain but mild cramping just before a bowel movement.  She has no fever.  No history of diverticulitis. No heartburn at this time.  She has not had this abdominal pain before.  No brown or bloody stool et [place] intake is minimal No consistent NSAID use.   Patient Active Problem List   Diagnosis Date Noted  . Varicose veins   . Varicose veins of left lower extremity   . Adjustment disorder with mixed anxiety and depressed mood 04/20/2014  . Overweight(278.02) 04/20/2014  . Contraception 04/20/2014    Past Medical History:  Diagnosis Date  . Anxiety    Adjustment disorder with mixed anxiety and depressed mood   . Arthritis   . Depression    Adjustment disorder with mixed anxiety and depressed mood     Past Surgical History:  Procedure Laterality Date  . ACL replacement    . BREAST BIOPSY    . BREAST EXCISIONAL BIOPSY Left   . CESAREAN SECTION    . CHOLECYSTECTOMY      Social History   Tobacco Use  . Smoking status: Never Smoker  . Smokeless tobacco: Never Used  Substance Use Topics  . Alcohol use: Yes    Alcohol/week: 0.0 oz    Comment: social 1 drink  . Drug use: No    Family History  Problem Relation Age of Onset  . Diabetes Mother   . Rheum arthritis Mother   . Cancer Father        esophageal  . Cataracts Father   . Lupus Sister   .  Allergies Son   . Anxiety disorder Son   . Cancer Maternal Grandmother        breast  . Breast cancer Maternal Grandmother   . Heart attack Maternal Grandfather   . Cancer Paternal Grandmother        pancreatic  . Heart disease Paternal Grandmother   . Heart attack Paternal Grandfather     Allergies  Allergen Reactions  . Tape Hives and Itching  . Percocet [Oxycodone-Acetaminophen] Itching    Medication list has been reviewed and updated.  Current Outpatient Medications on File Prior to Visit  Medication Sig Dispense Refill  . busPIRone (BUSPAR) 10 MG tablet Take 10 mg by mouth daily.    . cetirizine (ZYRTEC) 10 MG tablet Take 1 tablet (10 mg total) by mouth at bedtime. 90 tablet 3  . citalopram (CELEXA) 40 MG tablet 60 mg daily.     . clonazePAM (KLONOPIN) 1 MG tablet Take 2 mg by mouth at bedtime.     Marland Kitchen etonogestrel-ethinyl estradiol (NUVARING) 0.12-0.015 MG/24HR vaginal ring Place 1 each vaginally every 28 (twenty-eight) days. Insert vaginally and leave in place  for 3 consecutive weeks, then remove for 1 week.    Marland Kitchen Hyoscyamine Sulfate SL (LEVSIN/SL) 0.125 MG SUBL Place 0.125 mg under the tongue daily. 28 each 0  . lisinopril (PRINIVIL,ZESTRIL) 10 MG tablet Take 1 tablet (10 mg total) by mouth daily. 30 tablet 11  . Naproxen-Esomeprazole (VIMOVO PO) Take 20 mg by mouth daily as needed.    . ranitidine (ZANTAC) 150 MG tablet Take 1-2 tablets (150-300 mg total) by mouth 2 (two) times daily as needed for heartburn. 360 tablet 1   No current facility-administered medications on file prior to visit.     ROS ROS otherwise unremarkable unless listed above.  Physical Examination: BP 138/86   Pulse 68   Temp 98.5 F (36.9 C) (Oral)   Resp 16   Ht 5' 8" (1.727 m)   Wt 232 lb (105.2 kg)   SpO2 98%   BMI 35.28 kg/m  Ideal Body Weight: Weight in (lb) to have BMI = 25: 164.1  Physical Exam  Constitutional: She is oriented to person, place, and time. She appears well-developed  and well-nourished. No distress.  HENT:  Head: Normocephalic and atraumatic.  Right Ear: External ear normal.  Left Ear: External ear normal.  Eyes: Conjunctivae and EOM are normal. Pupils are equal, round, and reactive to light.  Cardiovascular: Normal rate.  Pulmonary/Chest: Effort normal. No respiratory distress.  Abdominal: Soft. Normal appearance and bowel sounds are normal. There is tenderness in the suprapubic area.  Neurological: She is alert and oriented to person, place, and time.  Skin: She is not diaphoretic.  Psychiatric: She has a normal mood and affect. Her behavior is normal.     Assessment and Plan: [name] is a 51 y.o. female who is here today for cc of  Chief Complaint  Patient presents with  . Follow-up    diarrhea/ pt still not better. stool is still runny  I am treating for possible diverticular disease at this time.   Advised Cipro and metronidazole.  etoh precaution advised. Follow up in 7-10 days.  Soft food diet at this time.  diverticular diet precautions also advised. alarming symptoms discussed. Diarrhea, unspecified type - Plan: CMP14+EGFR, TSH, ciprofloxacin (CIPRO) 500 MG tablet, metroNIDAZOLE (FLAGYL) 500 MG tablet, CANCELED: CBC  Ivar Drape, PA-C [facility] 12/05/2017 9:03 pm

## 2017-12-14 ENCOUNTER — Encounter: Payer: Self-pay | Admitting: Physician Assistant

## 2017-12-14 ENCOUNTER — Ambulatory Visit: Payer: BLUE CROSS/BLUE SHIELD | Admitting: Physician Assistant

## 2017-12-14 VITALS — BP 118/70 | HR 83 | Temp 98.0°F | Resp 16 | Ht 68.0 in | Wt 234.0 lb

## 2017-12-14 DIAGNOSIS — R197 Diarrhea, unspecified: Secondary | ICD-10-CM | POA: Diagnosis not present

## 2017-12-14 MED ORDER — METRONIDAZOLE 500 MG PO TABS: 500.0000 mg | ORAL_TABLET | Freq: Three times a day (TID) | ORAL | 0 refills | Status: DC

## 2017-12-14 MED ORDER — CIPROFLOXACIN HCL 500 MG PO TABS: 500.0000 mg | ORAL_TABLET | Freq: Two times a day (BID) | ORAL | 0 refills | Status: DC

## 2017-12-14 NOTE — Progress Notes (Signed)
PRIMARY CARE AT [facility] [address] [place] [address] [phone]  Date:  12/14/2017   Name:  [name]   DOB:  01/08/1966   MRN:  [id number]  PCP:  Shawnee Knapp, MD    History of Present Illness:  [name] is a 52 y.o. female patient who presents to PCP with  Chief Complaint  Patient presents with  . Follow-up    pt states her stool is starting to harded some, but she has the diarrhea sym     Patient was seen here 3 weeks ago for 1 week of watery diarrhea.   --stool culture was normal.   --she continued to have unformed stools directly after a 3 day course of ciprofloxacin.  As this had reached >10 days of symptoms, I treated her empirically for diverticulitis.  She had agreed to forego the ct scan at that time, as her abdominal pain was not severe.  She returns after the 7 day course of each.  She has completed the antibiotic but missed a few doses of the metronidazole.  Her stools are starting to form more consistently since 2 days ago.  No fever.  Abdominal cramping just prior to having the bowel movement.   Patient Active Problem List   Diagnosis Date Noted  . Varicose veins   . Varicose veins of left lower extremity   . Adjustment disorder with mixed anxiety and depressed mood 04/20/2014  . Overweight(278.02) 04/20/2014  . Contraception 04/20/2014    Past Medical History:  Diagnosis Date  . Anxiety    Adjustment disorder with mixed anxiety and depressed mood   . Arthritis   . Depression    Adjustment disorder with mixed anxiety and depressed mood     Past Surgical History:  Procedure Laterality Date  . ACL replacement    . BREAST BIOPSY    . BREAST EXCISIONAL BIOPSY Left   . CESAREAN SECTION    . CHOLECYSTECTOMY      Social History   Tobacco Use  . Smoking status: Never Smoker  . Smokeless tobacco: Never Used  Substance Use Topics  . Alcohol use: Yes    Alcohol/week: 0.0 oz    Comment: social 1 drink  . Drug use: No    Family History   Problem Relation Age of Onset  . Diabetes Mother   . Rheum arthritis Mother   . Cancer Father        esophageal  . Cataracts Father   . Lupus Sister   . Allergies Son   . Anxiety disorder Son   . Cancer Maternal Grandmother        breast  . Breast cancer Maternal Grandmother   . Heart attack Maternal Grandfather   . Cancer Paternal Grandmother        pancreatic  . Heart disease Paternal Grandmother   . Heart attack Paternal Grandfather     Allergies  Allergen Reactions  . Tape Hives and Itching  . Percocet [Oxycodone-Acetaminophen] Itching    Medication list has been reviewed and updated.  Current Outpatient Medications on File Prior to Visit  Medication Sig Dispense Refill  . busPIRone (BUSPAR) 10 MG tablet Take 10 mg by mouth daily.    . cetirizine (ZYRTEC) 10 MG tablet Take 1 tablet (10 mg total) by mouth at bedtime. 90 tablet 3  . citalopram (CELEXA) 40 MG tablet 60 mg daily.     . clonazePAM (KLONOPIN) 1 MG tablet Take 2 mg by mouth  at bedtime.     Marland Kitchen etonogestrel-ethinyl estradiol (NUVARING) 0.12-0.015 MG/24HR vaginal ring Place 1 each vaginally every 28 (twenty-eight) days. Insert vaginally and leave in place for 3 consecutive weeks, then remove for 1 week.    Marland Kitchen Hyoscyamine Sulfate SL (LEVSIN/SL) 0.125 MG SUBL Place 0.125 mg under the tongue daily. 28 each 0  . lisinopril (PRINIVIL,ZESTRIL) 10 MG tablet Take 1 tablet (10 mg total) by mouth daily. 30 tablet 11  . Naproxen-Esomeprazole (VIMOVO PO) Take 20 mg by mouth daily as needed.    . ranitidine (ZANTAC) 150 MG tablet Take 1-2 tablets (150-300 mg total) by mouth 2 (two) times daily as needed for heartburn. 360 tablet 1  . ciprofloxacin (CIPRO) 500 MG tablet Take 1 tablet (500 mg total) by mouth 2 (two) times daily. (Patient not taking: Reported on 12/14/2017) 14 tablet 0  . metroNIDAZOLE (FLAGYL) 500 MG tablet Take 1 tablet (500 mg total) by mouth 3 (three) times daily. (Patient not taking: Reported on 12/14/2017) 21  tablet 0   No current facility-administered medications on file prior to visit.     ROS ROS otherwise unremarkable unless listed above.  Physical Examination: BP 118/70   Pulse 83   Temp 98 F (36.7 C) (Oral)   Resp 16   Ht 5\' 8"  (1.727 m)   Wt 234 lb (106.1 kg)   SpO2 97%   BMI 35.58 kg/m  Ideal Body Weight: Weight in (lb) to have BMI = 25: 164.1  Physical Exam  Constitutional: She is oriented to person, place, and time. She appears well-developed and well-nourished. No distress.  HENT:  Head: Normocephalic and atraumatic.  Right Ear: External ear normal.  Left Ear: External ear normal.  Eyes: Conjunctivae and EOM are normal. Pupils are equal, round, and reactive to light.  Cardiovascular: Normal rate, regular rhythm and intact distal pulses. Exam reveals no friction rub.  No murmur heard. Pulmonary/Chest: Effort normal. No respiratory distress.  Abdominal: Soft. Normal appearance and bowel sounds are normal. There is no hepatosplenomegaly. There is no tenderness. No hernia.  Neurological: She is alert and oriented to person, place, and time.  Skin: She is not diaphoretic.  Psychiatric: She has a normal mood and affect. Her behavior is normal.     Assessment and Plan: [name] is a 52 y.o. female who is here today for cc of  Chief Complaint  Patient presents with  . Follow-up    pt states her stool is starting to harded some, but she has the diarrhea sym  --treatment for the diverticulitis appears to be improving symptoms. --referral made to gastroenterologist today for follow up.   Diarrhea, unspecified type - Plan: metroNIDAZOLE (FLAGYL) 500 MG tablet, ciprofloxacin (CIPRO) 500 MG tablet, Ambulatory referral to Gastroenterology  Ivar Drape, PA-C [facility] 1/7/20191:46 PM

## 2017-12-14 NOTE — Patient Instructions (Addendum)
Please take the medication as prescribed. You can take loperamide for the constipation at this time.  You can take 2mg  4 times per day as needed.  Or just refer to the dosing on the box.   Diverticulitis Diverticulitis is when small pockets in your large intestine (colon) get infected or swollen. This causes stomach pain and watery poop (diarrhea). These pouches are called diverticula. They form in people who have a condition called diverticulosis. Follow these instructions at home: Medicines  Take over-the-counter and prescription medicines only as told by your doctor. These include: ? Antibiotics. ? Pain medicines. ? Fiber pills. ? Probiotics. ? Stool softeners.  Do not drive or use heavy machinery while taking prescription pain medicine.  If you were prescribed an antibiotic, take it as told. Do not stop taking it even if you feel better. General instructions  Follow a diet as told by your doctor.  When you feel better, your doctor may tell you to change your diet. You may need to eat a lot of fiber. Fiber makes it easier to poop (have bowel movements). Healthy foods with fiber include: ? Berries. ? Beans. ? Lentils. ? Green vegetables.  Exercise 3 or more times a week. Aim for 30 minutes each time. Exercise enough to sweat and make your heart beat faster.  Keep all follow-up visits as told. This is important. You may need to have an exam of the large intestine. This is called a colonoscopy. Contact a doctor if:  Your pain does not get better.  You have a hard time eating or drinking.  You are not pooping like normal. Get help right away if:  Your pain gets worse.  Your problems do not get better.  Your problems get worse very fast.  You have a fever.  You throw up (vomit) more than one time.  You have poop that is: ? Bloody. ? Black. ? Tarry. Summary  Diverticulitis is when small pockets in your large intestine (colon) get infected or swollen.  Take  medicines only as told by your doctor.  Follow a diet as told by your doctor. This information is not intended to replace advice given to you by your health care provider. Make sure you discuss any questions you have with your health care provider. Document Released: 05/12/2008 Document Revised: 12/11/2016 Document Reviewed: 12/11/2016 Elsevier Interactive Patient Education  2017 [employer].     IF you received an x-ray today, you will receive an invoice from [facility]. Please contact [facility] at [phone] with questions or concerns regarding your invoice.   IF you received labwork today, you will receive an invoice from [place]. Please contact [facility] at [phone] with questions or concerns regarding your invoice.   Our billing staff will not be able to assist you with questions regarding bills from these companies.  You will be contacted with the lab results as soon as they are available. The fastest way to get your results is to activate your My Chart account. Instructions are located on the last page of this paperwork. If you have not heard from [place] regarding the results in 2 weeks, please contact this office.

## 2017-12-15 DIAGNOSIS — F411 Generalized anxiety disorder: Secondary | ICD-10-CM | POA: Diagnosis not present

## 2017-12-15 DIAGNOSIS — F410 Panic disorder [episodic paroxysmal anxiety] without agoraphobia: Secondary | ICD-10-CM | POA: Diagnosis not present

## 2017-12-15 DIAGNOSIS — F331 Major depressive disorder, recurrent, moderate: Secondary | ICD-10-CM | POA: Diagnosis not present

## 2017-12-17 ENCOUNTER — Encounter: Payer: Self-pay | Admitting: Gastroenterology

## 2017-12-22 DIAGNOSIS — Z01419 Encounter for gynecological examination (general) (routine) without abnormal findings: Secondary | ICD-10-CM | POA: Diagnosis not present

## 2017-12-22 DIAGNOSIS — Z6835 Body mass index (BMI) 35.0-35.9, adult: Secondary | ICD-10-CM | POA: Diagnosis not present

## 2017-12-28 ENCOUNTER — Encounter: Payer: Self-pay | Admitting: Family Medicine

## 2017-12-28 ENCOUNTER — Other Ambulatory Visit: Payer: Self-pay

## 2017-12-28 ENCOUNTER — Other Ambulatory Visit: Payer: Self-pay | Admitting: Gastroenterology

## 2017-12-28 ENCOUNTER — Ambulatory Visit: Payer: BLUE CROSS/BLUE SHIELD | Admitting: Family Medicine

## 2017-12-28 VITALS — BP 110/70 | HR 86 | Temp 98.0°F | Resp 16 | Ht 68.0 in | Wt 229.0 lb

## 2017-12-28 DIAGNOSIS — L509 Urticaria, unspecified: Secondary | ICD-10-CM | POA: Diagnosis not present

## 2017-12-28 DIAGNOSIS — R109 Unspecified abdominal pain: Secondary | ICD-10-CM | POA: Diagnosis not present

## 2017-12-28 DIAGNOSIS — R21 Rash and other nonspecific skin eruption: Secondary | ICD-10-CM

## 2017-12-28 DIAGNOSIS — R197 Diarrhea, unspecified: Secondary | ICD-10-CM | POA: Diagnosis not present

## 2017-12-28 DIAGNOSIS — R1084 Generalized abdominal pain: Secondary | ICD-10-CM | POA: Diagnosis not present

## 2017-12-28 LAB — POCT CBC
Granulocyte percent: 55.1 %G (ref 37–80)
HCT, POC: 41.8 % (ref 37.7–47.9)
HEMOGLOBIN: 14 g/dL (ref 12.2–16.2)
LYMPH, POC: 3.6 — AB (ref 0.6–3.4)
MCH, POC: 28.1 pg (ref 27–31.2)
MCHC: 33.4 g/dL (ref 31.8–35.4)
MCV: 84.2 fL (ref 80–97)
MID (CBC): 0.6 (ref 0–0.9)
MPV: 7.3 fL (ref 0–99.8)
PLATELET COUNT, POC: 293 10*3/uL (ref 142–424)
POC Granulocyte: 4.96 (ref 2–6.9)
POC LYMPH PERCENT: 38.5 %L (ref 10–50)
POC MID %: 6.4 % (ref 0–12)
RBC: 4.96 M/uL (ref 4.04–5.48)
RDW, POC: 13.5 %
WBC: 9.3 10*3/uL (ref 4.6–10.2)

## 2017-12-28 LAB — POCT URINALYSIS DIP (MANUAL ENTRY)
BILIRUBIN UA: NEGATIVE
Glucose, UA: NEGATIVE mg/dL
LEUKOCYTES UA: NEGATIVE
NITRITE UA: NEGATIVE
PH UA: 5.5 (ref 5.0–8.0)
Protein Ur, POC: 30 mg/dL — AB
Spec Grav, UA: >=1.03 — AB (ref 1.010–1.025)
UROBILINOGEN UA: 0.2 U/dL

## 2017-12-28 MED ORDER — HYDROXYZINE HCL 25 MG PO TABS: 25.0000 mg | ORAL_TABLET | ORAL | 0 refills | Status: DC | PRN

## 2017-12-28 MED ORDER — RANITIDINE HCL 150 MG PO TABS: 300.0000 mg | ORAL_TABLET | Freq: Two times a day (BID) | ORAL | 0 refills | Status: DC

## 2017-12-28 MED ORDER — CETIRIZINE HCL 10 MG PO TABS: 10.0000 mg | ORAL_TABLET | Freq: Every day | ORAL | 3 refills | Status: DC

## 2017-12-28 NOTE — Progress Notes (Signed)
Subjective:  By signing my name below, I, [name], attest that this documentation has been prepared under the direction and in the presence of Mary Cheadle, MD Electronically Signed: [name], ED Scribe 12/28/2017 at 6:20 PM.   Patient ID: [name], female    DOB: 1965-12-22, 52 y.o.   MRN: [id number]  Chief Complaint  Patient presents with  . Rash    on body since yesterday   HPI JAMIYAH [name] is a 52 y.o. female who presents to Primary Care at [facility] complaining of slightly pruritic, red rash to the arms, chest, abdomen first noticed yesterday and her back this morning. Pt states that she was placed on Flagyl and Cipro x 7 days for abdominal cramping and diarrhea which initially improved for a few days but has now worsened symptoms. She stopped antibiotics ~3 wks ago. Pt is scheduled for a CT on 1/28 with [facility] Dr. [name] who suspects either colitis or IBS-D.  Pt reports that she was experiencing HA, fatigue, generalized body aches, chills, night sweats, low back pain without fever x 2 days ago. She alternated ibuprofen and Tylenol 4 hrs later prior to onset of rash. Reports taking ibuprofen and Tylenol in the past without reactions. Denies sore throat, URI symptoms, UTI symptoms, change in bladder/bowels, sick contacts.  Past Medical History:  Diagnosis Date  . Anxiety    Adjustment disorder with mixed anxiety and depressed mood   . Arthritis   . Depression    Adjustment disorder with mixed anxiety and depressed mood    Current Outpatient Medications on File Prior to Visit  Medication Sig Dispense Refill  . busPIRone (BUSPAR) 10 MG tablet Take 10 mg by mouth daily.    . citalopram (CELEXA) 40 MG tablet 60 mg daily.     . clonazePAM (KLONOPIN) 1 MG tablet Take 2 mg by mouth at bedtime.     Marland Kitchen lisinopril (PRINIVIL,ZESTRIL) 10 MG tablet Take 1 tablet (10 mg total) by mouth daily. 30 tablet 11  . Omeprazole (PRILOSEC PO) Take by mouth as needed.    . cetirizine  (ZYRTEC) 10 MG tablet Take 1 tablet (10 mg total) by mouth at bedtime. (Patient not taking: Reported on 12/28/2017) 90 tablet 3  . etonogestrel-ethinyl estradiol (NUVARING) 0.12-0.015 MG/24HR vaginal ring Place 1 each vaginally every 28 (twenty-eight) days. Insert vaginally and leave in place for 3 consecutive weeks, then remove for 1 week.    Marland Kitchen Hyoscyamine Sulfate SL (LEVSIN/SL) 0.125 MG SUBL Place 0.125 mg under the tongue daily. (Patient not taking: Reported on 12/28/2017) 28 each 0  . metroNIDAZOLE (FLAGYL) 500 MG tablet Take 1 tablet (500 mg total) by mouth 3 (three) times daily. (Patient not taking: Reported on 12/28/2017) 9 tablet 0  . Naproxen-Esomeprazole (VIMOVO PO) Take 20 mg by mouth daily as needed.    . ranitidine (ZANTAC) 150 MG tablet Take 1-2 tablets (150-300 mg total) by mouth 2 (two) times daily as needed for heartburn. (Patient not taking: Reported on 12/28/2017) 360 tablet 1   No current facility-administered medications on file prior to visit.    Allergies  Allergen Reactions  . Tape Hives and Itching  . Percocet [Oxycodone-Acetaminophen] Itching   Past Surgical History:  Procedure Laterality Date  . ACL replacement    . BREAST BIOPSY    . BREAST EXCISIONAL BIOPSY Left   . CESAREAN SECTION    . CHOLECYSTECTOMY     Family History  Problem Relation Age of Onset  . Diabetes  Mother   . Rheum arthritis Mother   . Cancer Father        esophageal  . Cataracts Father   . Lupus Sister   . Allergies Son   . Anxiety disorder Son   . Cancer Maternal Grandmother        breast  . Breast cancer Maternal Grandmother   . Heart attack Maternal Grandfather   . Cancer Paternal Grandmother        pancreatic  . Heart disease Paternal Grandmother   . Heart attack Paternal Grandfather    Social History   Socioeconomic History  . Marital status: Divorced    Spouse name: None  . Number of children: None  . Years of education: None  . Highest education level: None  Social  Needs  . Financial resource strain: None  . Food insecurity - worry: None  . Food insecurity - inability: None  . Transportation needs - medical: None  . Transportation needs - non-medical: None  Occupational History  . Occupation: Best boy: [employer]  Tobacco Use  . Smoking status: Never Smoker  . Smokeless tobacco: Never Used  Substance and Sexual Activity  . Alcohol use: Yes    Alcohol/week: 0.0 oz    Comment: social 1 drink  . Drug use: No  . Sexual activity: Yes  Other Topics Concern  . None  Social History Narrative   Divorced. Education: [employer]. Pt does exercise.   Depression screen [facility] 2/9 12/28/2017 12/04/2017 12/04/2017 11/23/2017 12/20/2016  Decreased Interest 0 0 0 0 0  Down, Depressed, Hopeless 0 0 0 0 0  PHQ - 2 Score 0 0 0 0 0    Review of Systems  Constitutional: Positive for activity change, appetite change, chills, diaphoresis and fatigue. Negative for fever and unexpected weight change.  HENT: Negative for congestion, facial swelling, rhinorrhea, sinus pressure, sinus pain, sore throat, trouble swallowing and voice change.   Respiratory: Negative for cough and shortness of breath.   Cardiovascular: Negative for chest pain, palpitations and leg swelling.  Gastrointestinal: Positive for abdominal distention, abdominal pain and diarrhea. Negative for anal bleeding, blood in stool, constipation, nausea and vomiting.  Genitourinary: Positive for pelvic pain and vaginal bleeding. Negative for decreased urine volume, difficulty urinating, dysuria, enuresis, flank pain, frequency, hematuria, menstrual problem, urgency, vaginal discharge and vaginal pain.  Musculoskeletal: Positive for back pain (lower) and myalgias.  Skin: Positive for rash. Negative for wound.  Allergic/Immunologic: Negative for immunocompromised state.  Neurological: Positive for weakness and headaches. Negative for dizziness, tremors, light-headedness and numbness.    Hematological: Negative for adenopathy.  Psychiatric/Behavioral: Positive for sleep disturbance. Negative for agitation, behavioral problems, confusion, decreased concentration and dysphoric mood. The patient is not nervous/anxious.       Objective:   Physical Exam  Constitutional: She is oriented to person, place, and time. She appears well-developed and well-nourished. No distress.  HENT:  Head: Normocephalic and atraumatic.  Eyes: Conjunctivae and EOM are normal.  Neck: Neck supple. No tracheal deviation present.  Cardiovascular: Normal rate.  Pulmonary/Chest: Effort normal. No respiratory distress.  Abdominal: Bowel sounds are increased.  Tympanitic bowel sounds  Musculoskeletal: Normal range of motion.  Neurological: She is alert and oriented to person, place, and time.  Skin: Skin is warm and dry. Rash noted. Rash is macular.  Very prominent Blanching erythematous lacy patchy macular rash over anterior neck, chest, upper abdomen, and upper arms.  Now spreading distally down arms. Some more mild  erythematous blanching small frequently and widely spread 1-3 mm macules of rash on her mid back and flexor surface of forearms. None of face/pelvis, legs   Psychiatric: She has a normal mood and affect. Her behavior is normal.  Nursing note and vitals reviewed.  BP 110/70 (BP Location: Left Arm, Patient Position: Sitting, Cuff Size: Large)   Pulse 86   Temp 98 F (36.7 C) (Oral)   Resp 16   Ht '5\' 8"'  (1.727 m)   Wt 229 lb (103.9 kg)   SpO2 100%   BMI 34.82 kg/m        Assessment & Plan:   1. Urticaria   2. Diarrhea of presumed infectious origin   3. Abdominal cramping   4. Rash and nonspecific skin eruption    Very odd presentation - acute systemic illness sxs 2-3d ago with diaphoresis, chills, and myalgias. No URI sxs at all so will not test for flu.  No other sxs other than worsening diarrhea with now painful abdominal cramping before the explosive frequent episodes with  tenesmus.  This rash looks like either a viral exanthem or a drug rash - does not appear to be one of the types of rashes that is asstd with an autoimmune colitis).  No med exposure - pt took 1-2 doses of tylenol/ibuprofen this weekend but has had numerous times prior. Cipro and flagyl were completed ~3 wks ago. No other meds, otc, herbal. No new exposures - foods/air, topicals. Pruritis worsening.   Did have menses this weekend but has never had dysmenorrhea this severe w/ them (menses were normal - just abd cramping/diarrhea) precipitously worsened sev d ago. Saw GI at [facility] - Dr. Alessandra Bevels - today and has abd/pelvis CT scan next week, then planning for colonosocpy. He offered her prn dicyclomine initially but then decided to hold off as did not want to complicate rash picture   Considered prednisone but there is an abd/colon abscess in the differential of her sxs as she did have some initial mild-mod improvement on abx so will hold off and treat for allergic rxn with zyrtec, zantac, and freq prn hydroxyzine.  Will touch base after labs return and see how rash responds to above to reassess plan. May need to try to get stat abd/pelvic CT if not improving.  Recheck in 24-48s (sooner if worsening) w/ myself, Carlota Raspberry, or [place].  Orders Placed This Encounter  Procedures  . Sedimentation Rate  . Comprehensive metabolic panel  . C-reactive protein  . POCT CBC  . POCT urinalysis dipstick    Meds ordered this encounter  Medications  . cetirizine (ZYRTEC) 10 MG tablet    Sig: Take 1 tablet (10 mg total) by mouth at bedtime.    Dispense:  90 tablet    Refill:  3  . ranitidine (ZANTAC) 150 MG tablet    Sig: Take 2 tablets (300 mg total) by mouth 2 (two) times daily.    Dispense:  120 tablet    Refill:  0  . hydrOXYzine (ATARAX/VISTARIL) 25 MG tablet    Sig: Take 1 tablet (25 mg total) by mouth every 4 (four) hours as needed for itching.    Dispense:  30 tablet    Refill:  0    I personally  performed the services described in this documentation, which was scribed in my presence. The recorded information has been reviewed and considered, and addended by me as needed.   [name], M.D.  Primary Care at [facility] [address]  [place], [place] [address] [phone] phone [phone] fax  12/29/17 12:02 PM  12/29/16 12 PM ADDENDUM: CRP MARKEDLY ELEVATED WITH NORMAL ESR - CRP CHANGES MORE RAPIDLY SO COULD BE THAT THE SYSTEMIC INFLAMMATION JUST STARTED (HER RASH DID JUST BEGIN LAST NIGHT) - ALSO SUCH A HIGH CRP LEVEL IS CONCERNING FOR INFECTION. PT HAS HAD HER ABD/PELVIC CT RESCHED FOR TODAY SO WILL BE ON LOOK OUT FOR RESULTS AND DO HAVE CONCERN FOR POSS ABSCESS DUE TO ELEV CRP - will try to add on stat procalcitonin.  Results for orders placed or performed in visit on 12/28/17  Sedimentation Rate  Result Value Ref Range   Sed Rate 27 0 - 32 mm/hr  Comprehensive metabolic panel  Result Value Ref Range   Glucose 95 65 - 99 mg/dL   BUN 7 6 - 24 mg/dL   Creatinine, Ser 0.81 0.57 - 1.00 mg/dL   GFR calc non Af Amer 86 >59 mL/min/1.73   GFR calc Af Amer 99 >59 mL/min/1.73   BUN/Creatinine Ratio 9 9 - 23   Sodium 140 134 - 144 mmol/L   Potassium 3.4 (L) 3.5 - 5.2 mmol/L   Chloride 103 96 - 106 mmol/L   CO2 22 20 - 29 mmol/L   Calcium 8.6 (L) 8.7 - 10.2 mg/dL   Total Protein 6.7 6.0 - 8.5 g/dL   Albumin 4.0 3.5 - 5.5 g/dL   Globulin, Total 2.7 1.5 - 4.5 g/dL   Albumin/Globulin Ratio 1.5 1.2 - 2.2   Bilirubin Total 0.3 0.0 - 1.2 mg/dL   Alkaline Phosphatase 62 39 - 117 IU/L   AST 11 0 - 40 IU/L   ALT 13 0 - 32 IU/L  C-reactive protein  Result Value Ref Range   CRP 131.6 (H) 0.0 - 4.9 mg/L  POCT CBC  Result Value Ref Range   WBC 9.3 4.6 - 10.2 K/uL   Lymph, poc 3.6 (A) 0.6 - 3.4   POC LYMPH PERCENT 38.5 10 - 50 %L   MID (cbc) 0.6 0 - 0.9   POC MID % 6.4 0 - 12 %M   POC Granulocyte 4.96 2 - 6.9   Granulocyte percent 55.1 37 - 80 %G   RBC 4.96 4.04 - 5.48  M/uL   Hemoglobin 14.0 12.2 - 16.2 g/dL   HCT, POC 41.8 37.7 - 47.9 %   MCV 84.2 80 - 97 fL   MCH, POC 28.1 27 - 31.2 pg   MCHC 33.4 31.8 - 35.4 g/dL   RDW, POC 13.5 %   Platelet Count, POC 293 142 - 424 K/uL   MPV 7.3 0 - 99.8 fL  POCT urinalysis dipstick  Result Value Ref Range   Color, UA yellow yellow   Clarity, UA clear clear   Glucose, UA negative negative mg/dL   Bilirubin, UA negative negative   Ketones, POC UA trace (5) (A) negative mg/dL   Spec Grav, UA >=1.030 (A) 1.010 - 1.025   Blood, UA trace-intact (A) negative   pH, UA 5.5 5.0 - 8.0   Protein Ur, POC =30 (A) negative mg/dL   Urobilinogen, UA 0.2 0.2 or 1.0 E.U./dL   Nitrite, UA Negative Negative   Leukocytes, UA Negative Negative

## 2017-12-28 NOTE — Patient Instructions (Addendum)
This could be hives - from stress or illness, could be from a virus, could be from a medicine though I can't identify any cause of the medicine. Start the zyrtec and zantac tonight.  Take the zantac twice a day and the hydroxyzine every 4 hours. Recheck in 24 hrs with Carlota Raspberry or [place] or 48 hours with myself.     IF you received an x-ray today, you will receive an invoice from [facility]. Please contact [facility] at [phone] with questions or concerns regarding your invoice.   IF you received labwork today, you will receive an invoice from [facility]. Please contact [facility] at [phone] with questions or concerns regarding your invoice.   Our billing staff will not be able to assist you with questions regarding bills from these companies.  You will be contacted with the lab results as soon as they are available. The fastest way to get your results is to activate your My Chart account. Instructions are located on the last page of this paperwork. If you have not heard from [place] regarding the results in 2 weeks, please contact this office.     Hives Hives (urticaria) are itchy, red, swollen areas on your skin. Hives can appear on any part of your body and can vary in size. They can be as small as the tip of a pen or much larger. Hives often fade within 24 hours (acute hives). In other cases, new hives appear after old ones fade. This cycle can continue for several days or weeks (chronic hives). Hives result from your body's reaction to an irritant or to something that you are allergic to (trigger). When you are exposed to a trigger, your body releases a chemical (histamine) that causes redness, itching, and swelling. You can get hives immediately after being exposed to a trigger or hours later. Hives do not spread from person to person (are not contagious). Your hives may get worse with scratching, exercise, and emotional stress. What are the causes? Causes of this  condition include:  Allergies to certain foods or ingredients.  Insect bites or stings.  Exposure to pollen or pet dander.  Contact with latex or chemicals.  Spending time in sunlight, heat, or cold (exposure).  Exercise.  Stress.  You can also get hives from some medical conditions and treatments. These include:  Viruses, including the common cold.  Bacterial infections, such as urinary tract infections and strep throat.  Disorders such as vasculitis, lupus, or thyroid disease.  Certain medications.  Allergy shots.  Blood transfusions.  Sometimes, the cause of hives is not known (idiopathic hives). What increases the risk? This condition is more likely to develop in:  Women.  People who have food allergies, especially to citrus fruits, milk, eggs, peanuts, tree nuts, or shellfish.  People who are allergic to: ? Medicines. ? Latex. ? Insects. ? Animals. ? Pollen.  People who have certain medical conditions, includinglupus or thyroid disease.  What are the signs or symptoms? The main symptom of this condition is raised, itchyred or white bumps or patches on your skin. These areas may:  Become large and swollen (welts).  Change in shape and location, quickly and repeatedly.  Be separate hives or connect over a large area of skin.  Sting or become painful.  Turn white when pressed in the center (blanch).  In severe cases, yourhands, feet, and face may also become swollen. This may occur if hives develop deeper in your skin. How is this diagnosed? This condition is  diagnosed based on your symptoms, medical history, and physical exam. Your skin, urine, or blood may be tested to find out what is causing your hives and to rule out other health issues. Your health care provider may also remove a small sample of skin from the affected area and examine it under a microscope (biopsy). How is this treated? Treatment depends on the severity of your condition.  Your health care provider may recommend using cool, wet cloths (cool compresses) or taking cool showers to relieve itching. Hives are sometimes treated with medicines, including:  Antihistamines.  Corticosteroids.  Antibiotics.  An injectable medicine (omalizumab). Your health care provider may prescribe this if you have chronic idiopathic hives and you continue to have symptoms even after treatment with antihistamines.  Severe cases may require an emergency injection of adrenaline (epinephrine) to prevent a life-threatening allergic reaction (anaphylaxis). Follow these instructions at home: Medicines  Take or apply over-the-counter and prescription medicines only as told by your health care provider.  If you were prescribed an antibiotic medicine, use it as told by your health care provider. Do not stop taking the antibiotic even if you start to feel better. Skin Care  Apply cool compresses to the affected areas.  Do not scratch or rub your skin. General instructions  Do not take hot showers or baths. This can make itching worse.  Do not wear tight-fitting clothing.  Use sunscreen and wear protective clothing when you are outside.  Avoid any substances that cause your hives. Keep a journal to help you track what causes your hives. Write down: ? What medicines you take. ? What you eat and drink. ? What products you use on your skin.  Keep all follow-up visits as told by your health care provider. This is important. Contact a health care provider if:  Your symptoms are not controlled with medicine.  Your joints are painful or swollen. Get help right away if:  You have a fever.  You have pain in your abdomen.  Your tongue or lips are swollen.  Your eyelids are swollen.  Your chest or throat feels tight.  You have trouble breathing or swallowing. These symptoms may represent a serious problem that is an emergency. Do not wait to see if the symptoms will go away. Get  medical help right away. Call your local emergency services (911 in the [place] Do not drive yourself to the hospital. This information is not intended to replace advice given to you by your health care provider. Make sure you discuss any questions you have with your health care provider. Document Released: 11/24/2005 Document Revised: 04/23/2016 Document Reviewed: 09/12/2015 Elsevier Interactive Patient Education  2018 [employer].

## 2017-12-29 ENCOUNTER — Ambulatory Visit
Admission: RE | Admit: 2017-12-29 | Discharge: 2017-12-29 | Disposition: A | Discharge status: Home / Self Care | Payer: BLUE CROSS/BLUE SHIELD | Source: Ambulatory Visit | Attending: Gastroenterology | Admitting: Gastroenterology

## 2017-12-29 ENCOUNTER — Encounter: Payer: Self-pay | Admitting: Physician Assistant

## 2017-12-29 DIAGNOSIS — R109 Unspecified abdominal pain: Secondary | ICD-10-CM | POA: Diagnosis not present

## 2017-12-29 DIAGNOSIS — K529 Noninfective gastroenteritis and colitis, unspecified: Secondary | ICD-10-CM | POA: Diagnosis not present

## 2017-12-29 DIAGNOSIS — R21 Rash and other nonspecific skin eruption: Secondary | ICD-10-CM | POA: Diagnosis not present

## 2017-12-29 DIAGNOSIS — R1084 Generalized abdominal pain: Secondary | ICD-10-CM

## 2017-12-29 DIAGNOSIS — R197 Diarrhea, unspecified: Secondary | ICD-10-CM | POA: Diagnosis not present

## 2017-12-29 LAB — COMPREHENSIVE METABOLIC PANEL
A/G RATIO: 1.5 (ref 1.2–2.2)
ALBUMIN: 4 g/dL (ref 3.5–5.5)
ALT: 13 IU/L (ref 0–32)
AST: 11 IU/L (ref 0–40)
Alkaline Phosphatase: 62 IU/L (ref 39–117)
BUN / CREAT RATIO: 9 (ref 9–23)
BUN: 7 mg/dL (ref 6–24)
Bilirubin Total: 0.3 mg/dL (ref 0.0–1.2)
CO2: 22 mmol/L (ref 20–29)
Calcium: 8.6 mg/dL — ABNORMAL LOW (ref 8.7–10.2)
Chloride: 103 mmol/L (ref 96–106)
Creatinine, Ser: 0.81 mg/dL (ref 0.57–1.00)
GFR, EST AFRICAN AMERICAN: 99 mL/min/{1.73_m2} (ref >59)
GFR, EST NON AFRICAN AMERICAN: 86 mL/min/{1.73_m2} (ref >59)
GLOBULIN, TOTAL: 2.7 g/dL (ref 1.5–4.5)
Glucose: 95 mg/dL (ref 65–99)
Potassium: 3.4 mmol/L — ABNORMAL LOW (ref 3.5–5.2)
SODIUM: 140 mmol/L (ref 134–144)
TOTAL PROTEIN: 6.7 g/dL (ref 6.0–8.5)

## 2017-12-29 LAB — C-REACTIVE PROTEIN: CRP: 131.6 mg/L — ABNORMAL HIGH (ref 0.0–4.9)

## 2017-12-29 LAB — SEDIMENTATION RATE: Sed Rate: 27 mm/hr (ref 0–32)

## 2017-12-29 MED ORDER — IOPAMIDOL (ISOVUE-300) INJECTION 61%
125.0000 mL | Freq: Once | INTRAVENOUS | Status: AC | PRN
  Administered 2017-12-29: 125 mL via INTRAVENOUS

## 2017-12-29 NOTE — Addendum Note (Signed)
Addended by: Gari Crown D on: 12/29/2017 12:35 PM   Modules accepted: Orders

## 2017-12-30 ENCOUNTER — Telehealth: Payer: Self-pay | Admitting: Family Medicine

## 2017-12-30 DIAGNOSIS — K529 Noninfective gastroenteritis and colitis, unspecified: Secondary | ICD-10-CM

## 2017-12-30 NOTE — Telephone Encounter (Signed)
Copied from [place]. Topic: Quick Communication - See Telephone Encounter >> Dec 30, 2017  1:37 PM Bea Graff, NT wrote: CRM for notification. See Telephone encounter for: Pt would like a call from Dr. Brigitte Pulse regarding the CT scan she had done yesterday.  12/30/17.

## 2017-12-30 NOTE — Addendum Note (Signed)
Addended by: Shawnee Knapp on: 12/30/2017 01:41 AM   Modules accepted: Orders

## 2017-12-31 ENCOUNTER — Ambulatory Visit: Payer: BLUE CROSS/BLUE SHIELD | Admitting: Gastroenterology

## 2017-12-31 NOTE — Telephone Encounter (Signed)
Message re: CT results sent to Dr. Brigitte Pulse

## 2017-12-31 NOTE — Telephone Encounter (Signed)
Called pt. GI has put her on a 10d course of Augmentin, took the first pill tonight but already started to have some bowel improvement in the past 2d w/ decreased BM even before she started it. Rash, myalgias, headache are all completely resolved.  Start probiotic. If sxs are recurring or persisting at all, pt would like to come in to office for lab only visit for repeat inflammatory markers since her CRP was so high - see if it is improving at all on the current regimen. If she is having sx improvement, will hold off and f/u by phone with her GI dr in [place] as instructed

## 2018-01-04 ENCOUNTER — Other Ambulatory Visit: Payer: BLUE CROSS/BLUE SHIELD

## 2018-01-22 ENCOUNTER — Ambulatory Visit: Payer: BLUE CROSS/BLUE SHIELD | Admitting: Gastroenterology

## 2018-03-03 DIAGNOSIS — Z800 Family history of malignant neoplasm of digestive organs: Secondary | ICD-10-CM | POA: Diagnosis not present

## 2018-03-03 DIAGNOSIS — K529 Noninfective gastroenteritis and colitis, unspecified: Secondary | ICD-10-CM | POA: Diagnosis not present

## 2018-03-03 DIAGNOSIS — K219 Gastro-esophageal reflux disease without esophagitis: Secondary | ICD-10-CM | POA: Diagnosis not present

## 2018-03-03 DIAGNOSIS — R197 Diarrhea, unspecified: Secondary | ICD-10-CM | POA: Diagnosis not present

## 2018-03-10 DIAGNOSIS — K317 Polyp of stomach and duodenum: Secondary | ICD-10-CM | POA: Diagnosis not present

## 2018-03-10 DIAGNOSIS — R933 Abnormal findings on diagnostic imaging of other parts of digestive tract: Secondary | ICD-10-CM | POA: Diagnosis not present

## 2018-03-10 DIAGNOSIS — K228 Other specified diseases of esophagus: Secondary | ICD-10-CM | POA: Diagnosis not present

## 2018-03-10 DIAGNOSIS — Z800 Family history of malignant neoplasm of digestive organs: Secondary | ICD-10-CM | POA: Diagnosis not present

## 2018-03-10 DIAGNOSIS — K52832 Lymphocytic colitis: Secondary | ICD-10-CM | POA: Diagnosis not present

## 2018-03-10 DIAGNOSIS — K2930 Chronic superficial gastritis without bleeding: Secondary | ICD-10-CM | POA: Diagnosis not present

## 2018-03-17 ENCOUNTER — Encounter: Payer: Self-pay | Admitting: Physician Assistant

## 2018-03-17 DIAGNOSIS — K2930 Chronic superficial gastritis without bleeding: Secondary | ICD-10-CM | POA: Diagnosis not present

## 2018-03-17 DIAGNOSIS — K317 Polyp of stomach and duodenum: Secondary | ICD-10-CM | POA: Diagnosis not present

## 2018-03-17 DIAGNOSIS — K228 Other specified diseases of esophagus: Secondary | ICD-10-CM | POA: Diagnosis not present

## 2018-03-17 DIAGNOSIS — K52832 Lymphocytic colitis: Secondary | ICD-10-CM | POA: Diagnosis not present

## 2018-04-29 DIAGNOSIS — K52832 Lymphocytic colitis: Secondary | ICD-10-CM | POA: Diagnosis not present

## 2018-05-06 DIAGNOSIS — F410 Panic disorder [episodic paroxysmal anxiety] without agoraphobia: Secondary | ICD-10-CM | POA: Diagnosis not present

## 2018-05-06 DIAGNOSIS — F411 Generalized anxiety disorder: Secondary | ICD-10-CM | POA: Diagnosis not present

## 2018-05-06 DIAGNOSIS — F331 Major depressive disorder, recurrent, moderate: Secondary | ICD-10-CM | POA: Diagnosis not present

## 2018-07-11 ENCOUNTER — Other Ambulatory Visit: Payer: Self-pay | Admitting: Family Medicine

## 2018-07-11 DIAGNOSIS — I10 Essential (primary) hypertension: Secondary | ICD-10-CM

## 2018-07-28 ENCOUNTER — Other Ambulatory Visit: Payer: Self-pay | Admitting: Radiology

## 2018-07-28 DIAGNOSIS — N6320 Unspecified lump in the left breast, unspecified quadrant: Secondary | ICD-10-CM | POA: Diagnosis not present

## 2018-08-02 ENCOUNTER — Ambulatory Visit
Admission: RE | Admit: 2018-08-02 | Discharge: 2018-08-02 | Disposition: A | Discharge status: Home / Self Care | Payer: BLUE CROSS/BLUE SHIELD | Source: Ambulatory Visit | Attending: Radiology | Admitting: Radiology

## 2018-08-02 DIAGNOSIS — N6489 Other specified disorders of breast: Secondary | ICD-10-CM | POA: Diagnosis not present

## 2018-08-02 DIAGNOSIS — N6320 Unspecified lump in the left breast, unspecified quadrant: Secondary | ICD-10-CM

## 2018-08-02 DIAGNOSIS — R928 Other abnormal and inconclusive findings on diagnostic imaging of breast: Secondary | ICD-10-CM | POA: Diagnosis not present

## 2018-08-04 DIAGNOSIS — F410 Panic disorder [episodic paroxysmal anxiety] without agoraphobia: Secondary | ICD-10-CM | POA: Diagnosis not present

## 2018-08-04 DIAGNOSIS — F411 Generalized anxiety disorder: Secondary | ICD-10-CM | POA: Diagnosis not present

## 2018-08-04 DIAGNOSIS — F331 Major depressive disorder, recurrent, moderate: Secondary | ICD-10-CM | POA: Diagnosis not present

## 2018-08-10 DIAGNOSIS — M25561 Pain in right knee: Secondary | ICD-10-CM | POA: Diagnosis not present

## 2018-08-10 DIAGNOSIS — M1711 Unilateral primary osteoarthritis, right knee: Secondary | ICD-10-CM | POA: Diagnosis not present

## 2018-08-11 ENCOUNTER — Other Ambulatory Visit: Payer: Self-pay | Admitting: Family Medicine

## 2018-08-11 DIAGNOSIS — I10 Essential (primary) hypertension: Secondary | ICD-10-CM

## 2018-08-13 ENCOUNTER — Other Ambulatory Visit: Payer: Self-pay | Admitting: Obstetrics and Gynecology

## 2018-08-13 DIAGNOSIS — M25561 Pain in right knee: Secondary | ICD-10-CM | POA: Diagnosis not present

## 2018-08-13 DIAGNOSIS — Z1231 Encounter for screening mammogram for malignant neoplasm of breast: Secondary | ICD-10-CM

## 2018-08-16 DIAGNOSIS — M25561 Pain in right knee: Secondary | ICD-10-CM | POA: Diagnosis not present

## 2018-08-20 ENCOUNTER — Telehealth: Payer: Self-pay | Admitting: Family Medicine

## 2018-08-20 NOTE — Telephone Encounter (Signed)
Called to reschedule visit on 10/01/18 with Dr. Brigitte Pulse. Left detailed VM

## 2018-08-30 DIAGNOSIS — M25561 Pain in right knee: Secondary | ICD-10-CM | POA: Diagnosis not present

## 2018-09-16 DIAGNOSIS — D2372 Other benign neoplasm of skin of left lower limb, including hip: Secondary | ICD-10-CM | POA: Diagnosis not present

## 2018-09-16 DIAGNOSIS — D2361 Other benign neoplasm of skin of right upper limb, including shoulder: Secondary | ICD-10-CM | POA: Diagnosis not present

## 2018-09-16 DIAGNOSIS — L814 Other melanin hyperpigmentation: Secondary | ICD-10-CM | POA: Diagnosis not present

## 2018-09-16 DIAGNOSIS — D235 Other benign neoplasm of skin of trunk: Secondary | ICD-10-CM | POA: Diagnosis not present

## 2018-09-24 ENCOUNTER — Telehealth: Payer: Self-pay | Admitting: Family Medicine

## 2018-09-24 NOTE — Telephone Encounter (Signed)
Called and spoke with pt regarding their appt with Dr. Brigitte Pulse on 10/26/18. I was able to move their appt from 3:40 to 3:30 due to the template changes on [facility] schedule.  I advised of time, building number and late policy.

## 2018-09-27 ENCOUNTER — Ambulatory Visit
Admission: RE | Admit: 2018-09-27 | Discharge: 2018-09-27 | Disposition: A | Discharge status: Home / Self Care | Payer: BLUE CROSS/BLUE SHIELD | Source: Ambulatory Visit | Attending: Obstetrics and Gynecology | Admitting: Obstetrics and Gynecology

## 2018-09-27 ENCOUNTER — Other Ambulatory Visit: Payer: Self-pay | Admitting: Obstetrics and Gynecology

## 2018-09-27 DIAGNOSIS — N6489 Other specified disorders of breast: Secondary | ICD-10-CM | POA: Diagnosis not present

## 2018-09-27 DIAGNOSIS — R928 Other abnormal and inconclusive findings on diagnostic imaging of breast: Secondary | ICD-10-CM | POA: Diagnosis not present

## 2018-09-27 DIAGNOSIS — N6320 Unspecified lump in the left breast, unspecified quadrant: Secondary | ICD-10-CM

## 2018-09-27 DIAGNOSIS — Z1231 Encounter for screening mammogram for malignant neoplasm of breast: Secondary | ICD-10-CM

## 2018-09-28 ENCOUNTER — Other Ambulatory Visit: Payer: BLUE CROSS/BLUE SHIELD

## 2018-10-01 ENCOUNTER — Encounter: Payer: Self-pay | Admitting: Family Medicine

## 2018-10-10 ENCOUNTER — Other Ambulatory Visit: Payer: Self-pay | Admitting: Family Medicine

## 2018-10-10 DIAGNOSIS — I10 Essential (primary) hypertension: Secondary | ICD-10-CM

## 2018-10-11 NOTE — Telephone Encounter (Signed)
Requested Prescriptions  Pending Prescriptions Disp Refills  . lisinopril (PRINIVIL,ZESTRIL) 10 MG tablet [Pharmacy Med Name: LISINOPRIL 10 MG TABLET] 30 tablet 0    Sig: TAKE 1 TABLET BY MOUTH EVERY DAY     Cardiovascular:  ACE Inhibitors Failed - 10/10/2018  1:18 AM      Failed - Cr in normal range and within 180 days    Creat  Date Value Ref Range Status  04/20/2014 0.86 0.50 - 1.10 mg/dL Final   Creatinine, Ser  Date Value Ref Range Status  12/28/2017 0.81 0.57 - 1.00 mg/dL Final         Failed - K in normal range and within 180 days    Potassium  Date Value Ref Range Status  12/28/2017 3.4 (L) 3.5 - 5.2 mmol/L Final         Failed - Valid encounter within last 6 months    Recent Outpatient Visits          9 months ago Urticaria   [facility] [place], [place]   10 months ago Diarrhea, unspecified type   [facility] [place], [place], [place]   10 months ago Diarrhea, unspecified type   [facility] [place], [place], [place]   10 months ago Diarrhea, unspecified type   [facility] [place], [place], [place]   1 year ago Annual physical exam   [facility], [place], [place]      Future Appointments            In 2 weeks Shawnee Knapp, MD [facility] [place], [place] - Patient is not pregnant      Passed - Last BP in normal range    BP Readings from Last 1 Encounters:  12/28/17 110/70

## 2018-10-26 ENCOUNTER — Other Ambulatory Visit: Payer: Self-pay

## 2018-10-26 ENCOUNTER — Ambulatory Visit (INDEPENDENT_AMBULATORY_CARE_PROVIDER_SITE_OTHER): Payer: BLUE CROSS/BLUE SHIELD | Admitting: Family Medicine

## 2018-10-26 ENCOUNTER — Encounter: Payer: Self-pay | Admitting: Family Medicine

## 2018-10-26 VITALS — BP 126/77 | HR 85 | Temp 98.0°F | Resp 16 | Ht 68.11 in | Wt 227.0 lb

## 2018-10-26 DIAGNOSIS — Z0001 Encounter for general adult medical examination with abnormal findings: Secondary | ICD-10-CM

## 2018-10-26 DIAGNOSIS — R7982 Elevated C-reactive protein (CRP): Secondary | ICD-10-CM

## 2018-10-26 DIAGNOSIS — I10 Essential (primary) hypertension: Secondary | ICD-10-CM | POA: Diagnosis not present

## 2018-10-26 DIAGNOSIS — Z136 Encounter for screening for cardiovascular disorders: Secondary | ICD-10-CM | POA: Diagnosis not present

## 2018-10-26 DIAGNOSIS — Z113 Encounter for screening for infections with a predominantly sexual mode of transmission: Secondary | ICD-10-CM

## 2018-10-26 DIAGNOSIS — Z130 Encounter for screening for diseases of the blood and blood-forming organs and certain disorders involving the immune mechanism: Secondary | ICD-10-CM | POA: Diagnosis not present

## 2018-10-26 DIAGNOSIS — Z1389 Encounter for screening for other disorder: Secondary | ICD-10-CM

## 2018-10-26 DIAGNOSIS — Z1383 Encounter for screening for respiratory disorder NEC: Secondary | ICD-10-CM

## 2018-10-26 DIAGNOSIS — Z1329 Encounter for screening for other suspected endocrine disorder: Secondary | ICD-10-CM | POA: Diagnosis not present

## 2018-10-26 DIAGNOSIS — Z114 Encounter for screening for human immunodeficiency virus [HIV]: Secondary | ICD-10-CM

## 2018-10-26 DIAGNOSIS — Z0000 Encounter for general adult medical examination without abnormal findings: Secondary | ICD-10-CM

## 2018-10-26 LAB — POCT URINALYSIS DIP (MANUAL ENTRY)
Bilirubin, UA: NEGATIVE
Blood, UA: NEGATIVE
Glucose, UA: NEGATIVE mg/dL
Ketones, POC UA: NEGATIVE mg/dL
LEUKOCYTES UA: NEGATIVE
NITRITE UA: NEGATIVE
PH UA: 6 (ref 5.0–8.0)
PROTEIN UA: NEGATIVE mg/dL
Spec Grav, UA: 1.01 (ref 1.010–1.025)
Urobilinogen, UA: 0.2 E.U./dL

## 2018-10-26 MED ORDER — LISINOPRIL 10 MG PO TABS: 10.0000 mg | ORAL_TABLET | Freq: Every day | ORAL | 3 refills | Status: AC

## 2018-10-26 MED ORDER — TRIAMCINOLONE ACETONIDE 55 MCG/ACT NA AERO: 2.0000 | INHALATION_SPRAY | Freq: Every day | NASAL | 5 refills | Status: DC

## 2018-10-26 NOTE — Patient Instructions (Addendum)
If you have lab work done today you will be contacted with your lab results within the next 2 weeks.  If you have not heard from [place] then please contact us. The fastest way to get your results is to register for My Chart.   IF you received an x-ray today, you will receive an invoice from [facility] Radiology. Please contact [facility] at [phone] with questions or concerns regarding your invoice.   IF you received labwork today, you will receive an invoice from [place]. Please contact [facility] at [phone] with questions or concerns regarding your invoice.   Our billing staff will not be able to assist you with questions regarding bills from these companies.  You will be contacted with the lab results as soon as they are available. The fastest way to get your results is to activate your My Chart account. Instructions are located on the last page of this paperwork. If you have not heard from [place] regarding the results in 2 weeks, please contact this office.      Calcium Intake Recommendations Calcium is a mineral that affects many functions in the body, including:  Blood clotting.  Blood vessel function.  Nerve impulse conduction.  Hormone secretion.  Muscle contraction.  Bone and teeth functions.  Most of your body's calcium supply is stored in your bones and teeth. When your calcium stores are low, you may be at risk for low bone mass, bone loss, and bone fractures. Consuming enough calcium helps to grow healthy bones and teeth and to prevent breakdown over time. It is very important that you get enough calcium if you are:  A child undergoing rapid growth.  An adolescent girl.  A pre- or post-menopausal woman.  A woman whose menstrual cycle has stopped due to anorexia nervosa or regular intense exercise.  An individual with lactose intolerance or a milk allergy.  A vegetarian.  What is my plan? Try to consume the recommended amount of calcium daily  based on your age. Depending on your overall health, your health care provider may recommend increased calcium intake.General daily calcium intake recommendations by age are:  Birth to 6 months: 200 mg.  Infants 7 to 12 months: 260 mg.  Children 1 to 3 years: 700 mg.  Children 4 to 8 years: 1,000 mg.  Children 9 to 13 years: 1,300 mg.  Teens 14 to 18 years: 1,300 mg.  Adults 19 to 50 years: 1,000 mg.  Adult women 51 to 70 years: 1,200 mg.  Adult men 51 to 70 years: 1,000 mg.  Adults 71 years and older: 1,200 mg.  Pregnant and breastfeeding teens: 1,300 mg.  Pregnant and breastfeeding adults: 1,000 mg.  What do I need to know about calcium intake?  In order for the body to absorb calcium, it needs vitamin D. You can get vitamin D through: ? Direct exposure of the skin to sunlight. ? Foods, such as egg yolks, liver, saltwater fish, and fortified milk. ? Supplements.  Consuming too much calcium may cause: ? Constipation. ? Decreased absorption of iron and zinc. ? Kidney stones.  Calcium supplements may interact with certain medicines. Check with your health care provider before starting any calcium supplements.  Try to get most of your calcium from food. What foods can I eat? Grains  Fortified oatmeal. Fortified ready-to-eat cereals. Fortified frozen waffles. Vegetables Turnip greens. Broccoli. Fruits Fortified orange juice. Meats and Other Protein Sources Canned sardines with bones. Canned salmon with bones. Soy beans. Tofu.  Baked beans. Almonds. [place] nuts. Sunflower seeds. Dairy Milk. Yogurt. Cheese. Cottage cheese. Beverages Fortified soy milk. Fortified rice milk. Sweets/Desserts Pudding. Ice Cream. Milkshakes. Blackstrap molasses. The items listed above may not be a complete list of recommended foods or beverages. Contact your dietitian for more options. What foods can affect my calcium intake? It may be more difficult for your body to use calcium or  calcium may leave your body more quickly if you consume large amounts of:  Sodium.  Protein.  Caffeine.  Alcohol.  This information is not intended to replace advice given to you by your health care provider. Make sure you discuss any questions you have with your health care provider. Document Released: 07/08/2004 Document Revised: 06/13/2016 Document Reviewed: 05/02/2014 Elsevier Interactive Patient Education  2018 [place] protect organs, store calcium, and anchor muscles. Good health habits, such as eating nutritious foods and exercising regularly, are important for maintaining healthy bones. They can also help to prevent a condition that causes bones to lose density and become weak and brittle (osteoporosis). Why is bone mass important? Bone mass refers to the amount of bone tissue that you have. The higher your bone mass, the stronger your bones. An important step toward having healthy bones throughout life is to have strong and dense bones during childhood. A young adult who has a high bone mass is more likely to have a high bone mass later in life. Bone mass at its greatest it is called peak bone mass. A large decline in bone mass occurs in older adults. In women, it occurs about the time of menopause. During this time, it is important to practice good health habits, because if more bone is lost than what is replaced, the bones will become less healthy and more likely to break (fracture). If you find that you have a low bone mass, you may be able to prevent osteoporosis or further bone loss by changing your diet and lifestyle. How can I find out if my bone mass is low? Bone mass can be measured with an X-ray test that is called a bone mineral density (BMD) test. This test is recommended for all women who are age 73 or older. It may also be recommended for men who are age 13 or older, or for people who are more likely to develop osteoporosis due to:  Having  bones that break easily.  Having a long-term disease that weakens bones, such as kidney disease or rheumatoid arthritis.  Having menopause earlier than normal.  Taking medicine that weakens bones, such as steroids, thyroid hormones, or hormone treatment for breast cancer or prostate cancer.  Smoking.  Drinking three or more alcoholic drinks each day.  What are the nutritional recommendations for healthy bones? To have healthy bones, you need to get enough of the right minerals and vitamins. Most nutrition experts recommend getting these nutrients from the foods that you eat. Nutritional recommendations vary from person to person. Ask your health care provider what is healthy for you. Here are some general guidelines. Calcium Recommendations Calcium is the most important (essential) mineral for bone health. Most people can get enough calcium from their diet, but supplements may be recommended for people who are at risk for osteoporosis. Good sources of calcium include:  Dairy products, such as low-fat or nonfat milk, cheese, and yogurt.  Dark green leafy vegetables, such as bok choy and broccoli.  Calcium-fortified foods, such as orange juice, cereal, bread, soy beverages, and tofu  products.  Nuts, such as almonds.  Follow these recommended amounts for daily calcium intake:  Children, age 54?3: 700 mg.  Children, age 70?8: 1,000 mg.  Children, age 9?13: 1,300 mg.  Teens, age 548?18: 1,300 mg.  Adults, age 549?50: 1,000 mg.  Adults, age 50?70: ? Men: 1,000 mg. ? Women: 1,200 mg.  Adults, age 25 or older: 1,200 mg.  Pregnant and breastfeeding females: ? Teens: 1,300 mg. ? Adults: 1,000 mg.  Vitamin D Recommendations Vitamin D is the most essential vitamin for bone health. It helps the body to absorb calcium. Sunlight stimulates the skin to make vitamin D, so be sure to get enough sunlight. If you live in a cold climate or you do not get outside often, your health care  provider may recommend that you take vitamin D supplements. Good sources of vitamin D in your diet include:  Egg yolks.  Saltwater fish.  Milk and cereal fortified with vitamin D.  Follow these recommended amounts for daily vitamin D intake:  Children and teens, age 57?18: 87 international units.  Adults, age 73 or younger: 400-800 international units.  Adults, age 77 or older: 800-1,000 international units.  Other Nutrients Other nutrients for bone health include:  Phosphorus. This mineral is found in meat, poultry, dairy foods, nuts, and legumes. The recommended daily intake for adult men and adult women is 700 mg.  Magnesium. This mineral is found in seeds, nuts, dark green vegetables, and legumes. The recommended daily intake for adult men is 400?420 mg. For adult women, it is 310?320 mg.  Vitamin K. This vitamin is found in green leafy vegetables. The recommended daily intake is 120 mg for adult men and 90 mg for adult women.  What type of physical activity is best for building and maintaining healthy bones? Weight-bearing and strength-building activities are important for building and maintaining peak bone mass. Weight-bearing activities cause muscles and bones to work against gravity. Strength-building activities increases muscle strength that supports bones. Weight-bearing and muscle-building activities include:  Walking and hiking.  Jogging and running.  Dancing.  Gym exercises.  Lifting weights.  Tennis and racquetball.  Climbing stairs.  Aerobics.  Adults should get at least 30 minutes of moderate physical activity on most days. Children should get at least 60 minutes of moderate physical activity on most days. Ask your health care provide what type of exercise is best for you. Where can I find more information? For more information, check out the following websites:  [place]: [employer].se  [employer] of  Health: http://www.niams.AnonymousEar.fr.asp  This information is not intended to replace advice given to you by your health care provider. Make sure you discuss any questions you have with your health care provider. Document Released: 02/14/2004 Document Revised: 06/13/2016 Document Reviewed: 11/29/2014 Elsevier Interactive Patient Education  [employer].

## 2018-10-26 NOTE — Progress Notes (Signed)
Subjective:    Patient ID: [name]; female   DOB: 05/09/1966; 52 y.o.   MRN: [id number]  Chief Complaint  Patient presents with  . Annual Exam    HPI   Primary Preventative Screenings: Cervical Cancer: Sched for Dec 20, 2018 with gyn Family Planning: on Nexplanon ring STI screening:  Done by gyn Dr. [name] Breast Cancer: Had diagnostic 2 mos earlyier that was normal then  Colorectal Cancer: Had done earlier this year at 52 yo so Dr. Trula Slade told her as  Tobacco use/EtOH/substances: Bone Density: not taking extra calcium, can't do weight bearing exercise due to knee Cardiac: EKG 12/20/16 Weight/Blood sugar/Diet/Exercise: walking and changed eating - less carbs and smaller portion - goal weight 190. Exercise limited by knee pain. BMI Readings from Last 3 Encounters:  10/26/18 34.40 kg/m  12/28/17 34.82 kg/m  12/14/17 35.58 kg/m   No results found for: HGBA1C OTC/Vit/Supp/Herbal:  Takes a once a day mvi gummy. And vit D 1000iu bid. Dentist/Optho: Immunizations:  Immunization History  Administered Date(s) Administered  . Hepatitis B 12/08/2000  . Influenza,inj,Quad PF,6+ Mos 09/19/2018  . Influenza-Unspecified 09/07/2013, 10/08/2016, 10/04/2017  . 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 Anxiety    Adjustment disorder with mixed anxiety and depressed mood   . Arthritis   . Depression    Adjustment disorder with mixed anxiety and depressed mood    Past Surgical History:  Procedure Laterality Date  . ACL replacement    . BREAST BIOPSY    . BREAST EXCISIONAL BIOPSY Left   . CESAREAN SECTION    . CHOLECYSTECTOMY     Current Outpatient Medications on File Prior to Visit  Medication Sig Dispense Refill  . busPIRone (BUSPAR) 10 MG tablet Take 10 mg by mouth daily.    . citalopram (CELEXA) 40 MG tablet 60 mg daily.     . clonazePAM  (KLONOPIN) 1 MG tablet Take 2 mg by mouth at bedtime.     Marland Kitchen etonogestrel-ethinyl estradiol (NUVARING) 0.12-0.015 MG/24HR vaginal ring Place 1 each vaginally every 28 (twenty-eight) days. Insert vaginally and leave in place for 3 consecutive weeks, then remove for 1 week.    Marland Kitchen lisinopril (PRINIVIL,ZESTRIL) 10 MG tablet TAKE 1 TABLET BY MOUTH EVERY DAY 30 tablet 0   No current facility-administered medications on file prior to visit.    Allergies  Allergen Reactions  . Tape Hives and Itching  . Percocet [Oxycodone-Acetaminophen] Itching   Family History  Problem Relation Age of Onset  . Diabetes Mother   . Rheum arthritis Mother   . Cancer Father        esophageal  . Cataracts Father   . Lupus Sister   . Allergies Son   . Anxiety disorder Son   . Cancer Maternal Grandmother        breast  . Breast cancer Maternal Grandmother   . Heart attack Maternal Grandfather   . Cancer Paternal Grandmother        pancreatic  . Heart disease Paternal Grandmother   . Heart attack Paternal Grandfather    Social History   Socioeconomic History  . Marital status: Divorced    Spouse name: Not on file  . Number of children: Not on file  . Years of education: Not on file  . Highest education level: Not on file  Occupational History  .  Occupation: Best boy: [employer]  Social Needs  . Financial resource strain: Not on file  . Food insecurity:    Worry: Not on file    Inability: Not on file  . Transportation needs:    Medical: Not on file    Non-medical: Not on file  Tobacco Use  . Smoking status: Never Smoker  . Smokeless tobacco: Never Used  Substance and Sexual Activity  . Alcohol use: Yes    Comment: social 1 drink  . Drug use: No  . Sexual activity: Yes  Lifestyle  . Physical activity:    Days per week: Not on file    Minutes per session: Not on file  . Stress: Not on file  Relationships  . Social connections:    Talks on phone: Not on file    Gets  together: Not on file    Attends religious service: Not on file    Active member of club or organization: Not on file    Attends meetings of clubs or organizations: Not on file    Relationship status: Not on file  Other Topics Concern  . Not on file  Social History Narrative   Divorced. Education: [employer]. Pt does exercise.   Depression screen [facility] 2/9 12/28/2017 12/04/2017 12/04/2017 11/23/2017 12/20/2016  Decreased Interest 0 0 0 0 0  Down, Depressed, Hopeless 0 0 0 0 0  PHQ - 2 Score 0 0 0 0 0     ROS Otherwise as noted in HPI.  Objective:  BP 126/77   Pulse 85   Temp 98 F (36.7 C) (Oral)   Resp 16   Ht 5' 8.11" (1.73 m)   Wt 227 lb (103 kg)   SpO2 95%   BMI 34.40 kg/m   Visual Acuity Screening   [name] eye Left eye Both eyes  Without correction:     With correction: 20/20 20/20 20/20    Physical Exam  Constitutional: She is oriented to person, place, and time. She appears well-developed and well-nourished. No distress.  HENT:  Head: Normocephalic and atraumatic.  [name] Ear: Tympanic membrane, external ear and ear canal normal.  Left Ear: Tympanic membrane, external ear and ear canal normal.  Nose: Nose normal. No mucosal edema or rhinorrhea.  Mouth/Throat: Uvula is midline, oropharynx is clear and moist and mucous membranes are normal. No posterior oropharyngeal erythema.  Eyes: Pupils are equal, round, and reactive to light. Conjunctivae and EOM are normal. [name] eye exhibits no discharge. Left eye exhibits no discharge. No scleral icterus.  Neck: Normal range of motion. Neck supple. No thyromegaly present.  Cardiovascular: Normal rate, regular rhythm, normal heart sounds and intact distal pulses.  Pulmonary/Chest: Effort normal and breath sounds normal. No respiratory distress.  Abdominal: Soft. Bowel sounds are normal. There is no tenderness.  Musculoskeletal: She exhibits no edema.  Lymphadenopathy:    She has no cervical adenopathy.  Neurological: She is alert  and oriented to person, place, and time. She has normal reflexes.  Skin: Skin is warm and dry. She is not diaphoretic. No erythema.  Psychiatric: She has a normal mood and affect. Her behavior is normal.   [place] TESTING Office Visit on 10/26/2018  Component Date Value Ref Range Status  . Color, UA 10/26/2018 yellow  yellow Final  . Clarity, UA 10/26/2018 clear  clear Final  . Glucose, UA 10/26/2018 negative  negative mg/dL Final  . Bilirubin, UA 10/26/2018 negative  negative Final  . Ketones, POC UA  10/26/2018 negative  negative mg/dL Final  . Spec Grav, UA 10/26/2018 1.010  1.010 - 1.025 Final  . Blood, UA 10/26/2018 negative  negative Final  . pH, UA 10/26/2018 6.0  5.0 - 8.0 Final  . Protein Ur, POC 10/26/2018 negative  negative mg/dL Final  . Urobilinogen, UA 10/26/2018 0.2  0.2 or 1.0 E.U./dL Final  . Nitrite, UA 10/26/2018 Negative  Negative Final  . Leukocytes, UA 10/26/2018 Negative  Negative Final  . WBC 10/26/2018 7.4  3.4 - 10.8 x10E3/uL Final  . RBC 10/26/2018 4.67  3.77 - 5.28 x10E6/uL Final  . Hemoglobin 10/26/2018 13.2  11.1 - 15.9 g/dL Final  . Hematocrit 10/26/2018 39.5  34.0 - 46.6 % Final  . MCV 10/26/2018 85  79 - 97 fL Final  . MCH 10/26/2018 28.3  26.6 - 33.0 pg Final  . MCHC 10/26/2018 33.4  31.5 - 35.7 g/dL Final  . RDW 10/26/2018 13.1  12.3 - 15.4 % Final  . Platelets 10/26/2018 310  150 - 450 x10E3/uL Final  . Neutrophils 10/26/2018 46  Not Estab. % Final  . Lymphs 10/26/2018 47  Not Estab. % Final  . Monocytes 10/26/2018 6  Not Estab. % Final  . Eos 10/26/2018 1  Not Estab. % Final  . Basos 10/26/2018 0  Not Estab. % Final  . Neutrophils Absolute 10/26/2018 3.4  1.4 - 7.0 x10E3/uL Final  . Lymphocytes Absolute 10/26/2018 3.4* 0.7 - 3.1 x10E3/uL Final  . Monocytes Absolute 10/26/2018 0.4  0.1 - 0.9 x10E3/uL Final  . EOS (ABSOLUTE) 10/26/2018 0.1  0.0 - 0.4 x10E3/uL Final  . Basophils Absolute 10/26/2018 0.0  0.0 - 0.2 x10E3/uL Final  . Immature  Granulocytes 10/26/2018 0  Not Estab. % Final  . Immature Grans (Abs) 10/26/2018 0.0  0.0 - 0.1 x10E3/uL Final  . Glucose 10/26/2018 99  65 - 99 mg/dL Final  . BUN 10/26/2018 14  6 - 24 mg/dL Final  . Creatinine, Ser 10/26/2018 0.90  0.57 - 1.00 mg/dL Final  . GFR calc non Af Amer 10/26/2018 75  >59 mL/min/1.73 Final  . GFR calc Af Amer 10/26/2018 86  >59 mL/min/1.73 Final  . BUN/Creatinine Ratio 10/26/2018 16  9 - 23 Final  . Sodium 10/26/2018 141  134 - 144 mmol/L Final  . Potassium 10/26/2018 3.7  3.5 - 5.2 mmol/L Final  . Chloride 10/26/2018 103  96 - 106 mmol/L Final  . CO2 10/26/2018 22  20 - 29 mmol/L Final  . Calcium 10/26/2018 8.9  8.7 - 10.2 mg/dL Final  . Total Protein 10/26/2018 6.5  6.0 - 8.5 g/dL Final  . Albumin 10/26/2018 3.9  3.5 - 5.5 g/dL Final  . Globulin, Total 10/26/2018 2.6  1.5 - 4.5 g/dL Final  . Albumin/Globulin Ratio 10/26/2018 1.5  1.2 - 2.2 Final  . Bilirubin Total 10/26/2018 0.2  0.0 - 1.2 mg/dL Final  . Alkaline Phosphatase 10/26/2018 53  39 - 117 IU/L Final  . AST 10/26/2018 9  0 - 40 IU/L Final  . ALT 10/26/2018 12  0 - 32 IU/L Final  . TSH 10/26/2018 2.200  0.450 - 4.500 uIU/mL Final  . CRP 10/26/2018 13* 0 - 10 mg/L Final  . Sed Rate 10/26/2018 15  0 - 40 mm/hr Final  . HIV Screen 4th Generation wRfx 10/26/2018 Non Reactive  Non Reactive Final     Assessment & Plan:   1. Annual physical exam   2. Screening for cardiovascular, respiratory, and genitourinary diseases  3. Screening for thyroid disorder   4. Screening for deficiency anemia   5. Elevated C-reactive protein (CRP)   6. Screening for HIV (human immunodeficiency virus)   7. Routine screening for STI (sexually transmitted infection)   8. Essential hypertension     Patient will continue on current chronic medications other than changes noted above, so ok to refill when needed.   Reviewed all health maintenance recommendations per USPSTF guidelines.   See after visit summary for  patient specific instructions.  Orders Placed This Encounter  Procedures  . CBC with Differential/Platelet  . Comprehensive metabolic panel    Order Specific Question:   Has the patient fasted?    Answer:   Yes  . TSH  . C-reactive protein  . Sedimentation rate  . HIV Antibody (routine testing w rflx)  . POCT urinalysis dipstick    Meds ordered this encounter  Medications  . lisinopril (PRINIVIL,ZESTRIL) 10 MG tablet    Sig: Take 1 tablet (10 mg total) by mouth daily.    Dispense:  90 tablet    Refill:  3    Office visit needed for refills  . triamcinolone (NASACORT) 55 MCG/ACT AERO nasal inhaler    Sig: Place 2 sprays into the nose daily.    Dispense:  1 Inhaler    Refill:  5    Patient verbalized to me that they understand the following: diagnosis, what is being done for them, what to expect and what should be done at home.  Their questions have been answered. They understand that I am unable to predict every possible medication interaction or adverse outcome and that if any unexpected symptoms arise, they should contact us and their pharmacist, as well as never hesitate to seek urgent/emergent care at [facility] or ER if they think it might be warranted.    [name], MD, MPH [facility] at [place] [address] [place], [place]  [address] [phone] Office phone  [phone] Office fax   10/26/18 4:17 PM

## 2018-10-26 NOTE — Progress Notes (Deleted)
Subjective:    Patient ID: [name]; female   DOB: Aug 29, 1966; 52 y.o.   MRN: [id number]  Chief Complaint  Patient presents with  . Annual Exam    HPI Primary Preventative Screenings: Cervical Cancer:  Family Planning: STI screening: Breast Cancer: Colorectal Cancer: Tobacco use/EtOH/substances: Bone Density: Cardiac: Weight/Blood sugar/Diet/Exercise: BMI Readings from Last 3 Encounters:  12/28/17 34.82 kg/m  12/14/17 35.58 kg/m  12/04/17 35.28 kg/m   No results found for: HGBA1C OTC/Vit/Supp/Herbal: Dentist/Optho: Immunizations:  Immunization History  Administered Date(s) Administered  . Hepatitis B 12/08/2000  . Influenza,inj,Quad PF,6+ Mos 09/19/2018  . Influenza-Unspecified 09/07/2013, 10/08/2016, 10/04/2017  . Tdap 11/24/2012     Chronic Medical Conditions: ***  Medical History: Past Medical History:  Diagnosis Date  . Anxiety    Adjustment disorder with mixed anxiety and depressed mood   . Arthritis   . Depression    Adjustment disorder with mixed anxiety and depressed mood    Past Surgical History:  Procedure Laterality Date  . ACL replacement    . BREAST BIOPSY    . BREAST EXCISIONAL BIOPSY Left   . CESAREAN SECTION    . CHOLECYSTECTOMY     Current Outpatient Medications on File Prior to Visit  Medication Sig Dispense Refill  . busPIRone (BUSPAR) 10 MG tablet Take 10 mg by mouth daily.    . citalopram (CELEXA) 40 MG tablet 60 mg daily.     . clonazePAM (KLONOPIN) 1 MG tablet Take 2 mg by mouth at bedtime.     Marland Kitchen etonogestrel-ethinyl estradiol (NUVARING) 0.12-0.015 MG/24HR vaginal ring Place 1 each vaginally every 28 (twenty-eight) days. Insert vaginally and leave in place for 3 consecutive weeks, then remove for 1 week.    Marland Kitchen lisinopril (PRINIVIL,ZESTRIL) 10 MG tablet TAKE 1 TABLET BY MOUTH EVERY DAY 30 tablet 0   No current facility-administered medications on file prior to visit.    Allergies  Allergen Reactions  . Tape Hives  and Itching  . Percocet [Oxycodone-Acetaminophen] Itching   Family History  Problem Relation Age of Onset  . Diabetes Mother   . Rheum arthritis Mother   . Cancer Father        esophageal  . Cataracts Father   . Lupus Sister   . Allergies Son   . Anxiety disorder Son   . Cancer Maternal Grandmother        breast  . Breast cancer Maternal Grandmother   . Heart attack Maternal Grandfather   . Cancer Paternal Grandmother        pancreatic  . Heart disease Paternal Grandmother   . Heart attack Paternal Grandfather    Social History   Socioeconomic History  . Marital status: Divorced    Spouse name: Not on file  . Number of children: Not on file  . Years of education: Not on file  . Highest education level: Not on file  Occupational History  . Occupation: Best boy: [employer]  Social Needs  . Financial resource strain: Not on file  . Food insecurity:    Worry: Not on file    Inability: Not on file  . Transportation needs:    Medical: Not on file    Non-medical: Not on file  Tobacco Use  . Smoking status: Never Smoker  . Smokeless tobacco: Never Used  Substance and Sexual Activity  . Alcohol use: Yes    Comment: social 1 drink  . Drug use: No  . Sexual activity:  Yes  Lifestyle  . Physical activity:    Days per week: Not on file    Minutes per session: Not on file  . Stress: Not on file  Relationships  . Social connections:    Talks on phone: Not on file    Gets together: Not on file    Attends religious service: Not on file    Active member of club or organization: Not on file    Attends meetings of clubs or organizations: Not on file    Relationship status: Not on file  Other Topics Concern  . Not on file  Social History Narrative   Divorced. Education: [employer]. Pt does exercise.   Depression screen [facility] 2/9 12/28/2017 12/04/2017 12/04/2017 11/23/2017 12/20/2016  Decreased Interest 0 0 0 0 0  Down, Depressed, Hopeless 0 0 0 0 0  PHQ -  2 Score 0 0 0 0 0     ROS Otherwise as noted in HPI.  Objective:  There were no vitals taken for this visit. No exam data present Physical Exam       POC TESTING No visits with results within 3 Day(s) from this visit.  Latest known visit with results is:  Office Visit on 12/28/2017  Component Date Value Ref Range Status  . WBC 12/28/2017 9.3  4.6 - 10.2 K/uL Final  . Lymph, poc 12/28/2017 3.6* 0.6 - 3.4 Final  . POC LYMPH PERCENT 12/28/2017 38.5  10 - 50 %L Final  . MID (cbc) 12/28/2017 0.6  0 - 0.9 Final  . POC MID % 12/28/2017 6.4  0 - 12 %M Final  . POC Granulocyte 12/28/2017 4.96  2 - 6.9 Final  . Granulocyte percent 12/28/2017 55.1  37 - 80 %G Final  . RBC 12/28/2017 4.96  4.04 - 5.48 M/uL Final  . Hemoglobin 12/28/2017 14.0  12.2 - 16.2 g/dL Final  . HCT, POC 12/28/2017 41.8  37.7 - 47.9 % Final  . MCV 12/28/2017 84.2  80 - 97 fL Final  . MCH, POC 12/28/2017 28.1  27 - 31.2 pg Final  . MCHC 12/28/2017 33.4  31.8 - 35.4 g/dL Final  . RDW, POC 12/28/2017 13.5  % Final  . Platelet Count, POC 12/28/2017 293  142 - 424 K/uL Final  . MPV 12/28/2017 7.3  0 - 99.8 fL Final  . Sed Rate 12/28/2017 27  0 - 32 mm/hr Final  . Glucose 12/28/2017 95  65 - 99 mg/dL Final  . BUN 12/28/2017 7  6 - 24 mg/dL Final  . Creatinine, Ser 12/28/2017 0.81  0.57 - 1.00 mg/dL Final  . GFR calc non Af Amer 12/28/2017 86  >59 mL/min/1.73 Final  . GFR calc Af Amer 12/28/2017 99  >59 mL/min/1.73 Final  . BUN/Creatinine Ratio 12/28/2017 9  9 - 23 Final  . Sodium 12/28/2017 140  134 - 144 mmol/L Final  . Potassium 12/28/2017 3.4* 3.5 - 5.2 mmol/L Final  . Chloride 12/28/2017 103  96 - 106 mmol/L Final  . CO2 12/28/2017 22  20 - 29 mmol/L Final  . Calcium 12/28/2017 8.6* 8.7 - 10.2 mg/dL Final  . Total Protein 12/28/2017 6.7  6.0 - 8.5 g/dL Final  . Albumin 12/28/2017 4.0  3.5 - 5.5 g/dL Final  . Globulin, Total 12/28/2017 2.7  1.5 - 4.5 g/dL Final  . Albumin/Globulin Ratio 12/28/2017 1.5  1.2  - 2.2 Final  . Bilirubin Total 12/28/2017 0.3  0.0 - 1.2 mg/dL Final  . Alkaline Phosphatase 12/28/2017  62  39 - 117 IU/L Final  . AST 12/28/2017 11  0 - 40 IU/L Final  . ALT 12/28/2017 13  0 - 32 IU/L Final  . CRP 12/28/2017 131.6* 0.0 - 4.9 mg/L Final  . Color, UA 12/28/2017 yellow  yellow Final  . Clarity, UA 12/28/2017 clear  clear Final  . Glucose, UA 12/28/2017 negative  negative mg/dL Final  . Bilirubin, UA 12/28/2017 negative  negative Final  . Ketones, POC UA 12/28/2017 trace (5)* negative mg/dL Final  . Spec Grav, UA 12/28/2017 >=1.030* 1.010 - 1.025 Final  . Blood, UA 12/28/2017 trace-intact* negative Final  . pH, UA 12/28/2017 5.5  5.0 - 8.0 Final  . Protein Ur, POC 12/28/2017 =30* negative mg/dL Final  . Urobilinogen, UA 12/28/2017 0.2  0.2 or 1.0 E.U./dL Final  . Nitrite, UA 12/28/2017 Negative  Negative Final  . Leukocytes, UA 12/28/2017 Negative  Negative Final     Assessment & Plan:  No diagnosis found.  ***  Patient will continue on current chronic medications other than changes noted above, so ok to refill when needed.   Reviewed all health maintenance recommendations per USPSTF guidelines.   See after visit summary for patient specific instructions.  No orders of the defined types were placed in this encounter.   No orders of the defined types were placed in this encounter.   Patient verbalized to me that they understand the following: diagnosis, what is being done for them, what to expect and what should be done at home.  Their questions have been answered. They understand that I am unable to predict every possible medication interaction or adverse outcome and that if any unexpected symptoms arise, they should contact us and their pharmacist, as well as never hesitate to seek urgent/emergent care at [facility] or ER if they think it might be warranted.    [name], MD, MPH Primary [facility] at [facility] [address] [place], [place]  [address] [phone] Office phone  [phone] Office fax   10/26/18 4:13 PM

## 2018-10-27 LAB — COMPREHENSIVE METABOLIC PANEL
ALBUMIN: 3.9 g/dL (ref 3.5–5.5)
ALT: 12 IU/L (ref 0–32)
AST: 9 IU/L (ref 0–40)
Albumin/Globulin Ratio: 1.5 (ref 1.2–2.2)
Alkaline Phosphatase: 53 IU/L (ref 39–117)
BILIRUBIN TOTAL: 0.2 mg/dL (ref 0.0–1.2)
BUN / CREAT RATIO: 16 (ref 9–23)
BUN: 14 mg/dL (ref 6–24)
CHLORIDE: 103 mmol/L (ref 96–106)
CO2: 22 mmol/L (ref 20–29)
CREATININE: 0.9 mg/dL (ref 0.57–1.00)
Calcium: 8.9 mg/dL (ref 8.7–10.2)
GFR calc non Af Amer: 75 mL/min/{1.73_m2} (ref >59)
GFR, EST AFRICAN AMERICAN: 86 mL/min/{1.73_m2} (ref >59)
GLUCOSE: 99 mg/dL (ref 65–99)
Globulin, Total: 2.6 g/dL (ref 1.5–4.5)
Potassium: 3.7 mmol/L (ref 3.5–5.2)
Sodium: 141 mmol/L (ref 134–144)
TOTAL PROTEIN: 6.5 g/dL (ref 6.0–8.5)

## 2018-10-27 LAB — C-REACTIVE PROTEIN: CRP: 13 mg/L — ABNORMAL HIGH (ref 0–10)

## 2018-10-27 LAB — CBC WITH DIFFERENTIAL/PLATELET
BASOS ABS: 0 10*3/uL (ref 0.0–0.2)
Basos: 0 %
EOS (ABSOLUTE): 0.1 10*3/uL (ref 0.0–0.4)
Eos: 1 %
HEMOGLOBIN: 13.2 g/dL (ref 11.1–15.9)
Hematocrit: 39.5 % (ref 34.0–46.6)
IMMATURE GRANS (ABS): 0 10*3/uL (ref 0.0–0.1)
IMMATURE GRANULOCYTES: 0 %
LYMPHS: 47 %
Lymphocytes Absolute: 3.4 10*3/uL — ABNORMAL HIGH (ref 0.7–3.1)
MCH: 28.3 pg (ref 26.6–33.0)
MCHC: 33.4 g/dL (ref 31.5–35.7)
MCV: 85 fL (ref 79–97)
MONOCYTES: 6 %
Monocytes Absolute: 0.4 10*3/uL (ref 0.1–0.9)
NEUTROS PCT: 46 %
Neutrophils Absolute: 3.4 10*3/uL (ref 1.4–7.0)
PLATELETS: 310 10*3/uL (ref 150–450)
RBC: 4.67 x10E6/uL (ref 3.77–5.28)
RDW: 13.1 % (ref 12.3–15.4)
WBC: 7.4 10*3/uL (ref 3.4–10.8)

## 2018-10-27 LAB — TSH: TSH: 2.2 u[IU]/mL (ref 0.450–4.500)

## 2018-10-27 LAB — SEDIMENTATION RATE: SED RATE: 15 mm/h (ref 0–40)

## 2018-10-27 LAB — HIV ANTIBODY (ROUTINE TESTING W REFLEX): HIV Screen 4th Generation wRfx: NONREACTIVE

## 2018-10-29 ENCOUNTER — Other Ambulatory Visit: Payer: Self-pay

## 2018-10-29 MED ORDER — FLUTICASONE PROPIONATE 50 MCG/ACT NA SUSP: 2.0000 | Freq: Every day | NASAL | 6 refills | Status: AC

## 2018-11-07 DIAGNOSIS — I10 Essential (primary) hypertension: Secondary | ICD-10-CM | POA: Insufficient documentation

## 2018-11-07 DIAGNOSIS — R7982 Elevated C-reactive protein (CRP): Secondary | ICD-10-CM | POA: Insufficient documentation

## 2018-11-08 DIAGNOSIS — F331 Major depressive disorder, recurrent, moderate: Secondary | ICD-10-CM | POA: Diagnosis not present

## 2018-11-08 DIAGNOSIS — F410 Panic disorder [episodic paroxysmal anxiety] without agoraphobia: Secondary | ICD-10-CM | POA: Diagnosis not present

## 2018-11-08 DIAGNOSIS — F411 Generalized anxiety disorder: Secondary | ICD-10-CM | POA: Diagnosis not present

## 2018-11-10 DIAGNOSIS — M25561 Pain in right knee: Secondary | ICD-10-CM | POA: Diagnosis not present

## 2018-11-10 DIAGNOSIS — M25521 Pain in right elbow: Secondary | ICD-10-CM | POA: Diagnosis not present

## 2018-11-16 ENCOUNTER — Other Ambulatory Visit: Payer: Self-pay | Admitting: Family Medicine

## 2018-11-16 DIAGNOSIS — I10 Essential (primary) hypertension: Secondary | ICD-10-CM

## 2018-11-17 NOTE — Telephone Encounter (Signed)
Called pt who states she is getting this medication from [facility]. No refill needed.

## 2019-01-03 DIAGNOSIS — Z801 Family history of malignant neoplasm of trachea, bronchus and lung: Secondary | ICD-10-CM | POA: Diagnosis not present

## 2019-01-03 DIAGNOSIS — Z803 Family history of malignant neoplasm of breast: Secondary | ICD-10-CM | POA: Diagnosis not present

## 2019-01-03 DIAGNOSIS — Z808 Family history of malignant neoplasm of other organs or systems: Secondary | ICD-10-CM | POA: Diagnosis not present

## 2019-01-03 DIAGNOSIS — Z01419 Encounter for gynecological examination (general) (routine) without abnormal findings: Secondary | ICD-10-CM | POA: Diagnosis not present

## 2019-01-03 DIAGNOSIS — Z6834 Body mass index (BMI) 34.0-34.9, adult: Secondary | ICD-10-CM | POA: Diagnosis not present

## 2019-02-08 DIAGNOSIS — F411 Generalized anxiety disorder: Secondary | ICD-10-CM | POA: Diagnosis not present

## 2019-02-08 DIAGNOSIS — F331 Major depressive disorder, recurrent, moderate: Secondary | ICD-10-CM | POA: Diagnosis not present

## 2019-02-08 DIAGNOSIS — F410 Panic disorder [episodic paroxysmal anxiety] without agoraphobia: Secondary | ICD-10-CM | POA: Diagnosis not present

## 2019-02-14 DIAGNOSIS — Z809 Family history of malignant neoplasm, unspecified: Secondary | ICD-10-CM | POA: Diagnosis not present

## 2019-05-10 DIAGNOSIS — F411 Generalized anxiety disorder: Secondary | ICD-10-CM | POA: Diagnosis not present

## 2019-05-10 DIAGNOSIS — F331 Major depressive disorder, recurrent, moderate: Secondary | ICD-10-CM | POA: Diagnosis not present

## 2019-05-10 DIAGNOSIS — F410 Panic disorder [episodic paroxysmal anxiety] without agoraphobia: Secondary | ICD-10-CM | POA: Diagnosis not present

## 2019-05-24 DIAGNOSIS — K52832 Lymphocytic colitis: Secondary | ICD-10-CM | POA: Diagnosis not present

## 2019-05-24 DIAGNOSIS — K219 Gastro-esophageal reflux disease without esophagitis: Secondary | ICD-10-CM | POA: Diagnosis not present

## 2019-06-08 DIAGNOSIS — M25511 Pain in right shoulder: Secondary | ICD-10-CM | POA: Diagnosis not present

## 2019-06-08 DIAGNOSIS — M25512 Pain in left shoulder: Secondary | ICD-10-CM | POA: Diagnosis not present

## 2019-06-23 DIAGNOSIS — I10 Essential (primary) hypertension: Secondary | ICD-10-CM | POA: Diagnosis not present

## 2019-06-23 DIAGNOSIS — F411 Generalized anxiety disorder: Secondary | ICD-10-CM | POA: Diagnosis not present

## 2019-06-23 DIAGNOSIS — I8390 Asymptomatic varicose veins of unspecified lower extremity: Secondary | ICD-10-CM | POA: Diagnosis not present

## 2019-06-23 DIAGNOSIS — I8392 Asymptomatic varicose veins of left lower extremity: Secondary | ICD-10-CM | POA: Diagnosis not present

## 2019-07-27 DIAGNOSIS — M25511 Pain in right shoulder: Secondary | ICD-10-CM | POA: Diagnosis not present

## 2019-07-27 DIAGNOSIS — M25512 Pain in left shoulder: Secondary | ICD-10-CM | POA: Diagnosis not present

## 2019-08-08 DIAGNOSIS — F410 Panic disorder [episodic paroxysmal anxiety] without agoraphobia: Secondary | ICD-10-CM | POA: Diagnosis not present

## 2019-08-08 DIAGNOSIS — F411 Generalized anxiety disorder: Secondary | ICD-10-CM | POA: Diagnosis not present

## 2019-08-08 DIAGNOSIS — F331 Major depressive disorder, recurrent, moderate: Secondary | ICD-10-CM | POA: Diagnosis not present

## 2019-08-19 ENCOUNTER — Other Ambulatory Visit: Payer: Self-pay | Admitting: Obstetrics and Gynecology

## 2019-08-19 DIAGNOSIS — Z1231 Encounter for screening mammogram for malignant neoplasm of breast: Secondary | ICD-10-CM

## 2019-08-29 DIAGNOSIS — M25561 Pain in right knee: Secondary | ICD-10-CM | POA: Diagnosis not present

## 2019-08-29 DIAGNOSIS — M25511 Pain in right shoulder: Secondary | ICD-10-CM | POA: Diagnosis not present

## 2019-09-22 DIAGNOSIS — L573 Poikiloderma of Civatte: Secondary | ICD-10-CM | POA: Diagnosis not present

## 2019-09-22 DIAGNOSIS — L821 Other seborrheic keratosis: Secondary | ICD-10-CM | POA: Diagnosis not present

## 2019-09-22 DIAGNOSIS — L720 Epidermal cyst: Secondary | ICD-10-CM | POA: Diagnosis not present

## 2019-09-22 DIAGNOSIS — D225 Melanocytic nevi of trunk: Secondary | ICD-10-CM | POA: Diagnosis not present

## 2019-09-28 DIAGNOSIS — M25512 Pain in left shoulder: Secondary | ICD-10-CM | POA: Diagnosis not present

## 2019-09-28 DIAGNOSIS — M25511 Pain in right shoulder: Secondary | ICD-10-CM | POA: Diagnosis not present

## 2019-10-05 ENCOUNTER — Ambulatory Visit
Admission: RE | Admit: 2019-10-05 | Discharge: 2019-10-05 | Disposition: A | Discharge status: Home / Self Care | Payer: BLUE CROSS/BLUE SHIELD | Source: Ambulatory Visit | Attending: Obstetrics and Gynecology | Admitting: Obstetrics and Gynecology

## 2019-10-05 ENCOUNTER — Other Ambulatory Visit: Payer: Self-pay

## 2019-10-05 DIAGNOSIS — Z1231 Encounter for screening mammogram for malignant neoplasm of breast: Secondary | ICD-10-CM | POA: Diagnosis not present

## 2019-10-07 ENCOUNTER — Other Ambulatory Visit: Payer: Self-pay | Admitting: Obstetrics and Gynecology

## 2019-10-07 DIAGNOSIS — R928 Other abnormal and inconclusive findings on diagnostic imaging of breast: Secondary | ICD-10-CM

## 2019-10-10 ENCOUNTER — Ambulatory Visit
Admission: RE | Admit: 2019-10-10 | Discharge: 2019-10-10 | Disposition: A | Discharge status: Home / Self Care | Payer: BC Managed Care – PPO | Source: Ambulatory Visit | Attending: Obstetrics and Gynecology | Admitting: Obstetrics and Gynecology

## 2019-10-10 ENCOUNTER — Other Ambulatory Visit: Payer: Self-pay

## 2019-10-10 ENCOUNTER — Other Ambulatory Visit: Payer: Self-pay | Admitting: Obstetrics and Gynecology

## 2019-10-10 DIAGNOSIS — R928 Other abnormal and inconclusive findings on diagnostic imaging of breast: Secondary | ICD-10-CM

## 2019-10-10 DIAGNOSIS — N6489 Other specified disorders of breast: Secondary | ICD-10-CM

## 2019-10-12 ENCOUNTER — Other Ambulatory Visit: Payer: Self-pay | Admitting: Obstetrics and Gynecology

## 2019-10-12 ENCOUNTER — Ambulatory Visit
Admission: RE | Admit: 2019-10-12 | Discharge: 2019-10-12 | Disposition: A | Discharge status: Home / Self Care | Payer: BC Managed Care – PPO | Source: Ambulatory Visit | Attending: Obstetrics and Gynecology | Admitting: Obstetrics and Gynecology

## 2019-10-12 ENCOUNTER — Other Ambulatory Visit: Payer: Self-pay

## 2019-10-12 DIAGNOSIS — N6489 Other specified disorders of breast: Secondary | ICD-10-CM

## 2019-10-12 DIAGNOSIS — N651 Disproportion of reconstructed breast: Secondary | ICD-10-CM | POA: Diagnosis not present

## 2019-10-17 ENCOUNTER — Other Ambulatory Visit: Payer: Self-pay | Admitting: Obstetrics and Gynecology

## 2019-10-17 DIAGNOSIS — N6489 Other specified disorders of breast: Secondary | ICD-10-CM

## 2019-11-01 DIAGNOSIS — F410 Panic disorder [episodic paroxysmal anxiety] without agoraphobia: Secondary | ICD-10-CM | POA: Diagnosis not present

## 2019-11-01 DIAGNOSIS — F331 Major depressive disorder, recurrent, moderate: Secondary | ICD-10-CM | POA: Diagnosis not present

## 2019-11-01 DIAGNOSIS — F411 Generalized anxiety disorder: Secondary | ICD-10-CM | POA: Diagnosis not present

## 2019-11-18 DIAGNOSIS — Z131 Encounter for screening for diabetes mellitus: Secondary | ICD-10-CM | POA: Diagnosis not present

## 2019-11-18 DIAGNOSIS — I10 Essential (primary) hypertension: Secondary | ICD-10-CM | POA: Diagnosis not present

## 2019-11-18 DIAGNOSIS — F4323 Adjustment disorder with mixed anxiety and depressed mood: Secondary | ICD-10-CM | POA: Diagnosis not present

## 2019-11-18 DIAGNOSIS — F411 Generalized anxiety disorder: Secondary | ICD-10-CM | POA: Diagnosis not present

## 2019-11-27 DIAGNOSIS — Z20828 Contact with and (suspected) exposure to other viral communicable diseases: Secondary | ICD-10-CM | POA: Diagnosis not present

## 2020-01-25 DIAGNOSIS — F410 Panic disorder [episodic paroxysmal anxiety] without agoraphobia: Secondary | ICD-10-CM | POA: Diagnosis not present

## 2020-01-25 DIAGNOSIS — F331 Major depressive disorder, recurrent, moderate: Secondary | ICD-10-CM | POA: Diagnosis not present

## 2020-01-25 DIAGNOSIS — F411 Generalized anxiety disorder: Secondary | ICD-10-CM | POA: Diagnosis not present

## 2020-02-01 DIAGNOSIS — L309 Dermatitis, unspecified: Secondary | ICD-10-CM | POA: Diagnosis not present

## 2020-02-01 DIAGNOSIS — L438 Other lichen planus: Secondary | ICD-10-CM | POA: Diagnosis not present

## 2020-03-19 DIAGNOSIS — L433 Subacute (active) lichen planus: Secondary | ICD-10-CM | POA: Diagnosis not present

## 2020-04-11 ENCOUNTER — Other Ambulatory Visit: Payer: BC Managed Care – PPO

## 2020-04-12 DIAGNOSIS — Z6836 Body mass index (BMI) 36.0-36.9, adult: Secondary | ICD-10-CM | POA: Diagnosis not present

## 2020-04-12 DIAGNOSIS — Z01419 Encounter for gynecological examination (general) (routine) without abnormal findings: Secondary | ICD-10-CM | POA: Diagnosis not present

## 2020-04-23 DIAGNOSIS — L433 Subacute (active) lichen planus: Secondary | ICD-10-CM | POA: Diagnosis not present

## 2020-04-24 DIAGNOSIS — F331 Major depressive disorder, recurrent, moderate: Secondary | ICD-10-CM | POA: Diagnosis not present

## 2020-04-24 DIAGNOSIS — F411 Generalized anxiety disorder: Secondary | ICD-10-CM | POA: Diagnosis not present

## 2020-04-24 DIAGNOSIS — F410 Panic disorder [episodic paroxysmal anxiety] without agoraphobia: Secondary | ICD-10-CM | POA: Diagnosis not present

## 2020-04-30 ENCOUNTER — Ambulatory Visit: Payer: BC Managed Care – PPO

## 2020-04-30 ENCOUNTER — Other Ambulatory Visit: Payer: Self-pay

## 2020-04-30 ENCOUNTER — Ambulatory Visit
Admission: RE | Admit: 2020-04-30 | Discharge: 2020-04-30 | Disposition: A | Discharge status: Home / Self Care | Payer: BC Managed Care – PPO | Source: Ambulatory Visit | Attending: Obstetrics and Gynecology | Admitting: Obstetrics and Gynecology

## 2020-04-30 DIAGNOSIS — R197 Diarrhea, unspecified: Secondary | ICD-10-CM | POA: Diagnosis not present

## 2020-04-30 DIAGNOSIS — N6489 Other specified disorders of breast: Secondary | ICD-10-CM

## 2020-04-30 DIAGNOSIS — R928 Other abnormal and inconclusive findings on diagnostic imaging of breast: Secondary | ICD-10-CM | POA: Diagnosis not present

## 2020-04-30 DIAGNOSIS — K52832 Lymphocytic colitis: Secondary | ICD-10-CM | POA: Diagnosis not present

## 2020-06-04 DIAGNOSIS — K219 Gastro-esophageal reflux disease without esophagitis: Secondary | ICD-10-CM | POA: Diagnosis not present

## 2020-06-04 DIAGNOSIS — K52832 Lymphocytic colitis: Secondary | ICD-10-CM | POA: Diagnosis not present

## 2020-06-05 DIAGNOSIS — Z780 Asymptomatic menopausal state: Secondary | ICD-10-CM | POA: Diagnosis not present

## 2020-07-17 DIAGNOSIS — F331 Major depressive disorder, recurrent, moderate: Secondary | ICD-10-CM | POA: Diagnosis not present

## 2020-07-17 DIAGNOSIS — F410 Panic disorder [episodic paroxysmal anxiety] without agoraphobia: Secondary | ICD-10-CM | POA: Diagnosis not present

## 2020-07-17 DIAGNOSIS — F411 Generalized anxiety disorder: Secondary | ICD-10-CM | POA: Diagnosis not present

## 2020-09-03 DIAGNOSIS — D2372 Other benign neoplasm of skin of left lower limb, including hip: Secondary | ICD-10-CM | POA: Diagnosis not present

## 2020-09-03 DIAGNOSIS — L433 Subacute (active) lichen planus: Secondary | ICD-10-CM | POA: Diagnosis not present

## 2020-09-10 ENCOUNTER — Other Ambulatory Visit: Payer: Self-pay | Admitting: Obstetrics and Gynecology

## 2020-09-10 DIAGNOSIS — Z1231 Encounter for screening mammogram for malignant neoplasm of breast: Secondary | ICD-10-CM

## 2020-10-04 DIAGNOSIS — L281 Prurigo nodularis: Secondary | ICD-10-CM | POA: Diagnosis not present

## 2020-10-04 DIAGNOSIS — D2372 Other benign neoplasm of skin of left lower limb, including hip: Secondary | ICD-10-CM | POA: Diagnosis not present

## 2020-10-04 DIAGNOSIS — D225 Melanocytic nevi of trunk: Secondary | ICD-10-CM | POA: Diagnosis not present

## 2020-10-04 DIAGNOSIS — L821 Other seborrheic keratosis: Secondary | ICD-10-CM | POA: Diagnosis not present

## 2020-10-05 ENCOUNTER — Other Ambulatory Visit: Payer: Self-pay

## 2020-10-05 ENCOUNTER — Ambulatory Visit
Admission: RE | Admit: 2020-10-05 | Discharge: 2020-10-05 | Disposition: A | Discharge status: Home / Self Care | Payer: BC Managed Care – PPO | Source: Ambulatory Visit | Attending: Obstetrics and Gynecology | Admitting: Obstetrics and Gynecology

## 2020-10-05 DIAGNOSIS — Z1231 Encounter for screening mammogram for malignant neoplasm of breast: Secondary | ICD-10-CM

## 2020-10-09 ENCOUNTER — Other Ambulatory Visit: Payer: Self-pay | Admitting: Obstetrics and Gynecology

## 2020-10-09 DIAGNOSIS — N6320 Unspecified lump in the left breast, unspecified quadrant: Secondary | ICD-10-CM

## 2020-10-09 DIAGNOSIS — N644 Mastodynia: Secondary | ICD-10-CM

## 2020-10-10 DIAGNOSIS — F410 Panic disorder [episodic paroxysmal anxiety] without agoraphobia: Secondary | ICD-10-CM | POA: Diagnosis not present

## 2020-10-10 DIAGNOSIS — F411 Generalized anxiety disorder: Secondary | ICD-10-CM | POA: Diagnosis not present

## 2020-10-10 DIAGNOSIS — F331 Major depressive disorder, recurrent, moderate: Secondary | ICD-10-CM | POA: Diagnosis not present

## 2020-10-11 DIAGNOSIS — N6320 Unspecified lump in the left breast, unspecified quadrant: Secondary | ICD-10-CM | POA: Diagnosis not present

## 2020-10-12 ENCOUNTER — Other Ambulatory Visit: Payer: Self-pay

## 2020-10-12 ENCOUNTER — Ambulatory Visit
Admission: RE | Admit: 2020-10-12 | Discharge: 2020-10-12 | Disposition: A | Discharge status: Home / Self Care | Payer: BC Managed Care – PPO | Source: Ambulatory Visit | Attending: Obstetrics and Gynecology | Admitting: Obstetrics and Gynecology

## 2020-10-12 DIAGNOSIS — N6489 Other specified disorders of breast: Secondary | ICD-10-CM | POA: Diagnosis not present

## 2020-10-12 DIAGNOSIS — N644 Mastodynia: Secondary | ICD-10-CM

## 2020-10-12 DIAGNOSIS — N6320 Unspecified lump in the left breast, unspecified quadrant: Secondary | ICD-10-CM

## 2020-10-12 DIAGNOSIS — R928 Other abnormal and inconclusive findings on diagnostic imaging of breast: Secondary | ICD-10-CM | POA: Diagnosis not present

## 2020-11-15 DIAGNOSIS — E663 Overweight: Secondary | ICD-10-CM | POA: Diagnosis not present

## 2020-11-15 DIAGNOSIS — Z79899 Other long term (current) drug therapy: Secondary | ICD-10-CM | POA: Diagnosis not present

## 2020-11-15 DIAGNOSIS — Z5181 Encounter for therapeutic drug level monitoring: Secondary | ICD-10-CM | POA: Diagnosis not present

## 2020-11-15 DIAGNOSIS — I10 Essential (primary) hypertension: Secondary | ICD-10-CM | POA: Diagnosis not present

## 2020-11-19 DIAGNOSIS — R82998 Other abnormal findings in urine: Secondary | ICD-10-CM | POA: Diagnosis not present

## 2020-11-19 DIAGNOSIS — F4323 Adjustment disorder with mixed anxiety and depressed mood: Secondary | ICD-10-CM | POA: Diagnosis not present

## 2020-11-19 DIAGNOSIS — F411 Generalized anxiety disorder: Secondary | ICD-10-CM | POA: Diagnosis not present

## 2020-11-19 DIAGNOSIS — I10 Essential (primary) hypertension: Secondary | ICD-10-CM | POA: Diagnosis not present

## 2020-11-19 DIAGNOSIS — I8392 Asymptomatic varicose veins of left lower extremity: Secondary | ICD-10-CM | POA: Diagnosis not present

## 2020-11-19 DIAGNOSIS — Z0001 Encounter for general adult medical examination with abnormal findings: Secondary | ICD-10-CM | POA: Diagnosis not present

## 2020-12-17 DIAGNOSIS — B372 Candidiasis of skin and nail: Secondary | ICD-10-CM | POA: Diagnosis not present

## 2020-12-17 DIAGNOSIS — N760 Acute vaginitis: Secondary | ICD-10-CM | POA: Diagnosis not present

## 2020-12-17 DIAGNOSIS — Z113 Encounter for screening for infections with a predominantly sexual mode of transmission: Secondary | ICD-10-CM | POA: Diagnosis not present

## 2021-01-09 DIAGNOSIS — F331 Major depressive disorder, recurrent, moderate: Secondary | ICD-10-CM | POA: Diagnosis not present

## 2021-01-09 DIAGNOSIS — F410 Panic disorder [episodic paroxysmal anxiety] without agoraphobia: Secondary | ICD-10-CM | POA: Diagnosis not present

## 2021-01-09 DIAGNOSIS — F411 Generalized anxiety disorder: Secondary | ICD-10-CM | POA: Diagnosis not present

## 2021-02-08 DIAGNOSIS — Z20822 Contact with and (suspected) exposure to covid-19: Secondary | ICD-10-CM | POA: Diagnosis not present

## 2021-02-25 DIAGNOSIS — T1512XA Foreign body in conjunctival sac, left eye, initial encounter: Secondary | ICD-10-CM | POA: Diagnosis not present

## 2021-04-04 DIAGNOSIS — F411 Generalized anxiety disorder: Secondary | ICD-10-CM | POA: Diagnosis not present

## 2021-04-04 DIAGNOSIS — F331 Major depressive disorder, recurrent, moderate: Secondary | ICD-10-CM | POA: Diagnosis not present

## 2021-04-04 DIAGNOSIS — F410 Panic disorder [episodic paroxysmal anxiety] without agoraphobia: Secondary | ICD-10-CM | POA: Diagnosis not present

## 2021-05-10 DIAGNOSIS — Z20822 Contact with and (suspected) exposure to covid-19: Secondary | ICD-10-CM | POA: Diagnosis not present

## 2021-05-21 DIAGNOSIS — Z20822 Contact with and (suspected) exposure to covid-19: Secondary | ICD-10-CM | POA: Diagnosis not present

## 2021-05-23 DIAGNOSIS — U071 COVID-19: Secondary | ICD-10-CM | POA: Diagnosis not present

## 2021-05-23 DIAGNOSIS — I10 Essential (primary) hypertension: Secondary | ICD-10-CM | POA: Diagnosis not present

## 2021-05-23 DIAGNOSIS — R0981 Nasal congestion: Secondary | ICD-10-CM | POA: Diagnosis not present

## 2021-05-23 DIAGNOSIS — R197 Diarrhea, unspecified: Secondary | ICD-10-CM | POA: Diagnosis not present

## 2021-05-30 DIAGNOSIS — R197 Diarrhea, unspecified: Secondary | ICD-10-CM | POA: Diagnosis not present

## 2021-05-30 DIAGNOSIS — M7501 Adhesive capsulitis of right shoulder: Secondary | ICD-10-CM | POA: Diagnosis not present

## 2021-05-30 DIAGNOSIS — F4323 Adjustment disorder with mixed anxiety and depressed mood: Secondary | ICD-10-CM | POA: Diagnosis not present

## 2021-05-30 DIAGNOSIS — F411 Generalized anxiety disorder: Secondary | ICD-10-CM | POA: Diagnosis not present

## 2021-05-30 DIAGNOSIS — I8392 Asymptomatic varicose veins of left lower extremity: Secondary | ICD-10-CM | POA: Diagnosis not present

## 2021-05-30 DIAGNOSIS — Z5181 Encounter for therapeutic drug level monitoring: Secondary | ICD-10-CM | POA: Diagnosis not present

## 2021-06-26 DIAGNOSIS — F410 Panic disorder [episodic paroxysmal anxiety] without agoraphobia: Secondary | ICD-10-CM | POA: Diagnosis not present

## 2021-06-26 DIAGNOSIS — F331 Major depressive disorder, recurrent, moderate: Secondary | ICD-10-CM | POA: Diagnosis not present

## 2021-06-26 DIAGNOSIS — F411 Generalized anxiety disorder: Secondary | ICD-10-CM | POA: Diagnosis not present

## 2021-08-14 IMAGING — MG MM BREAST BX W LOC DEV 1ST LESION IMAGE BX SPEC STEREO GUIDE*L*
8 of 9 series · 8 of 17 positions shown · non-contrast
Comparison: Previous exams.
COMPARISON: Previous exams.

Addendum:
CLINICAL DATA: Patient presents for stereotactic core needle biopsy
of a an asymmetry/mass in the posterior aspect of the left breast.

EXAM:
LEFT BREAST STEREOTACTIC CORE NEEDLE BIOPSY

[L (1 of 6)]
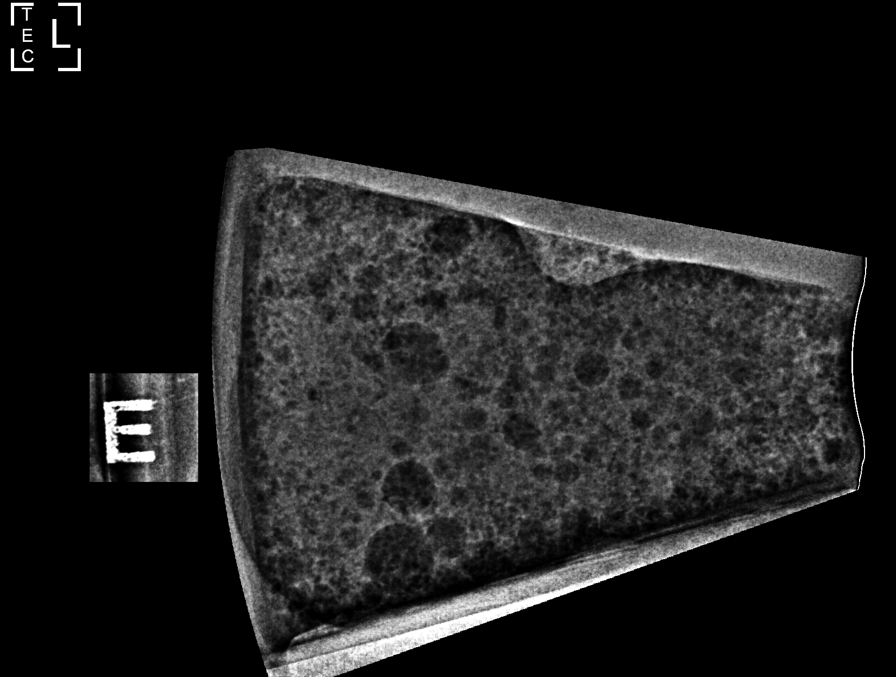

[L (2 of 6)]
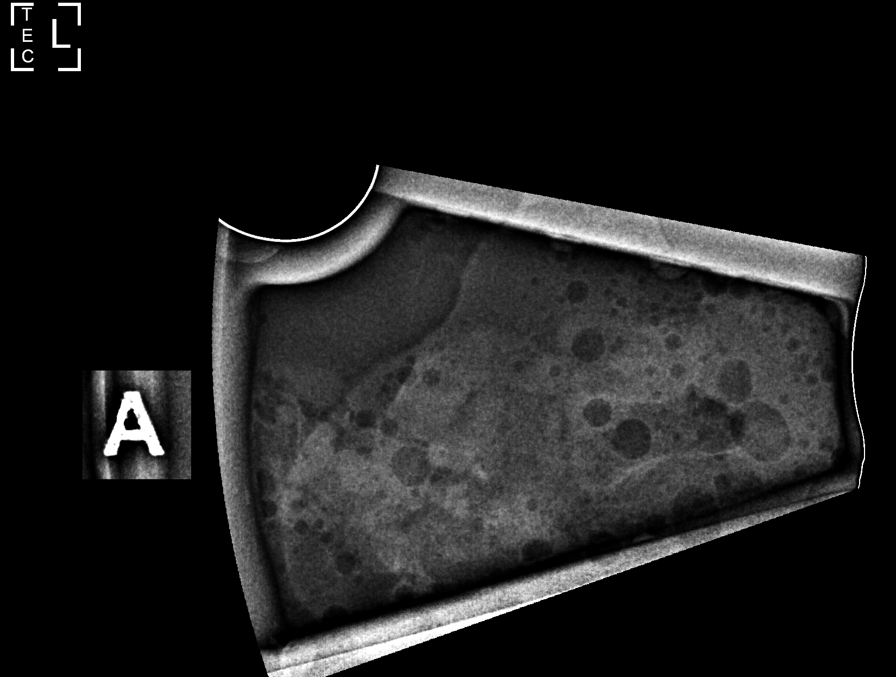

[L (3 of 6)]
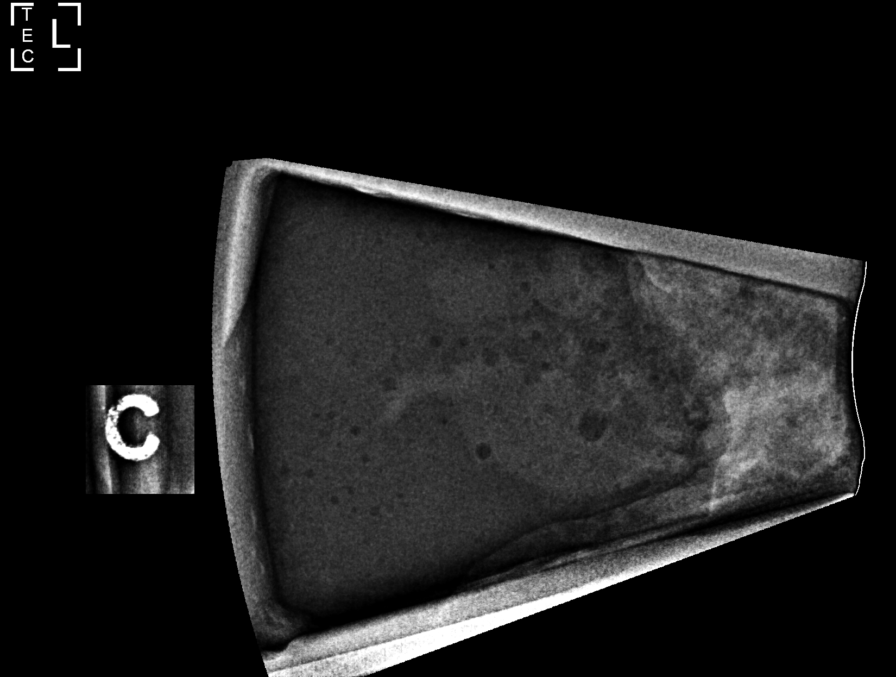

[L (4 of 6)]
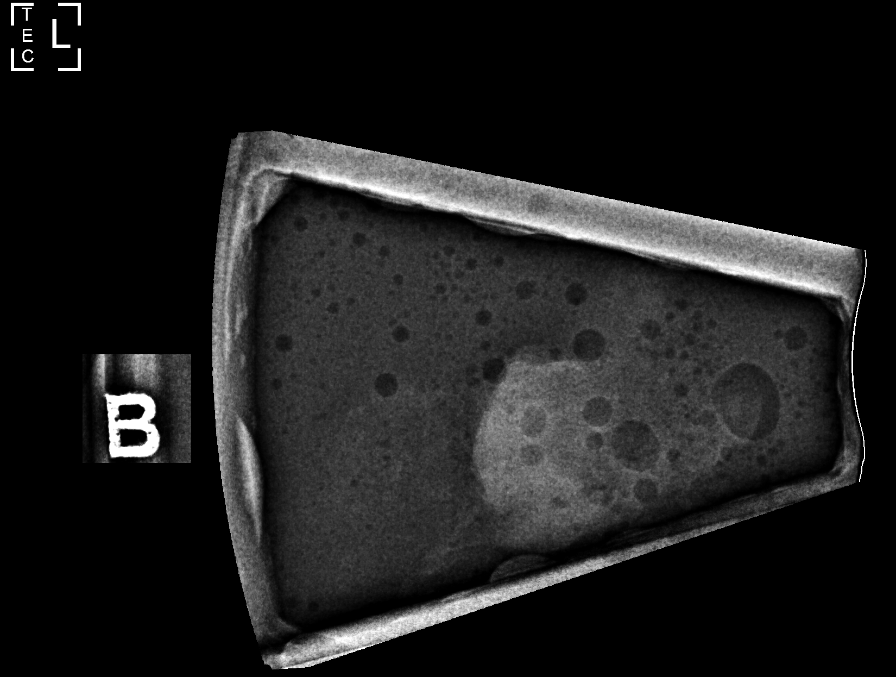

[L (5 of 6)]
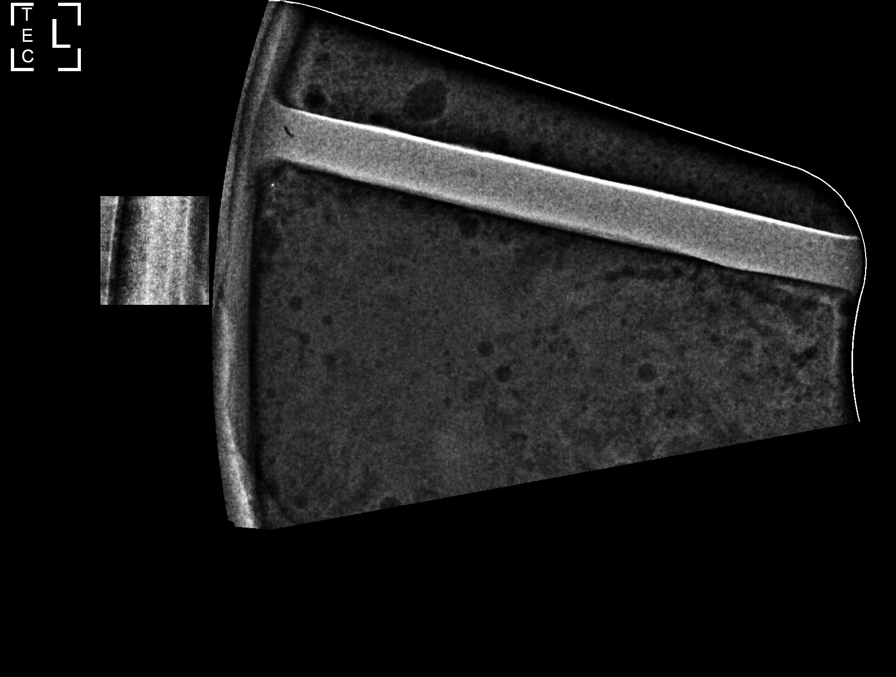

[L (6 of 6)]
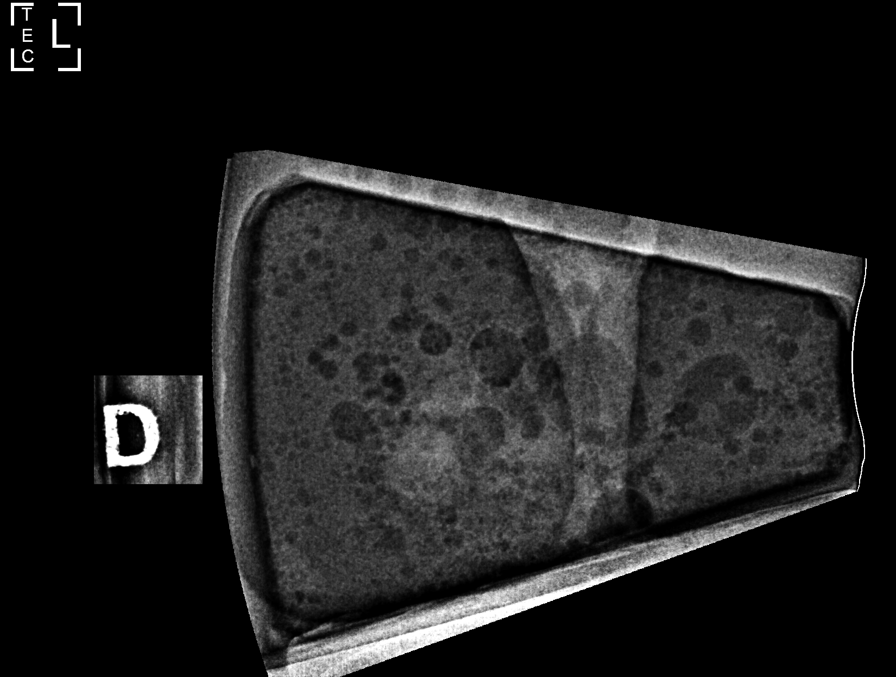

[L CC]
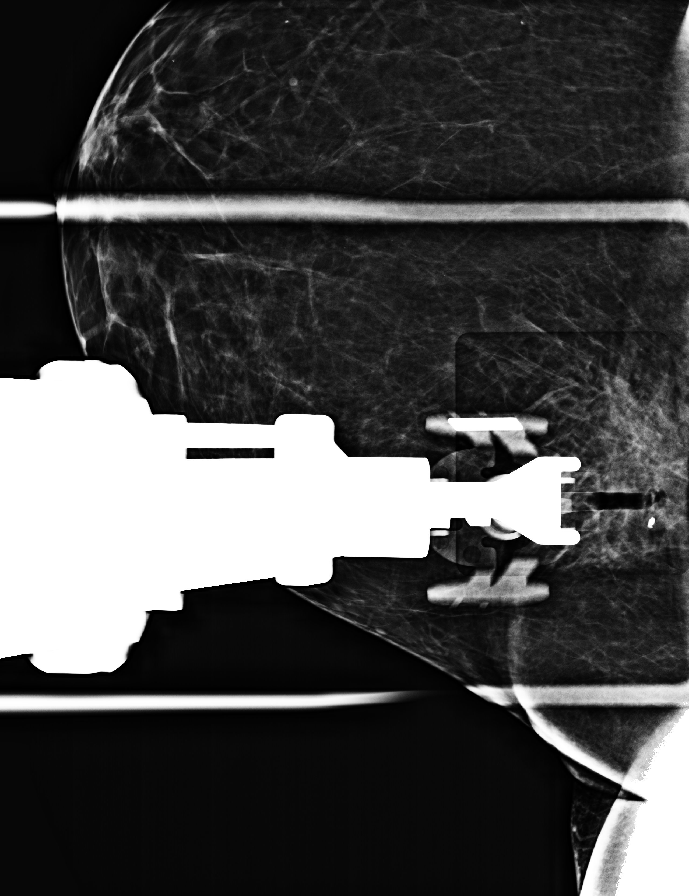

[L CC tomo · tomo slice 41/80.0]
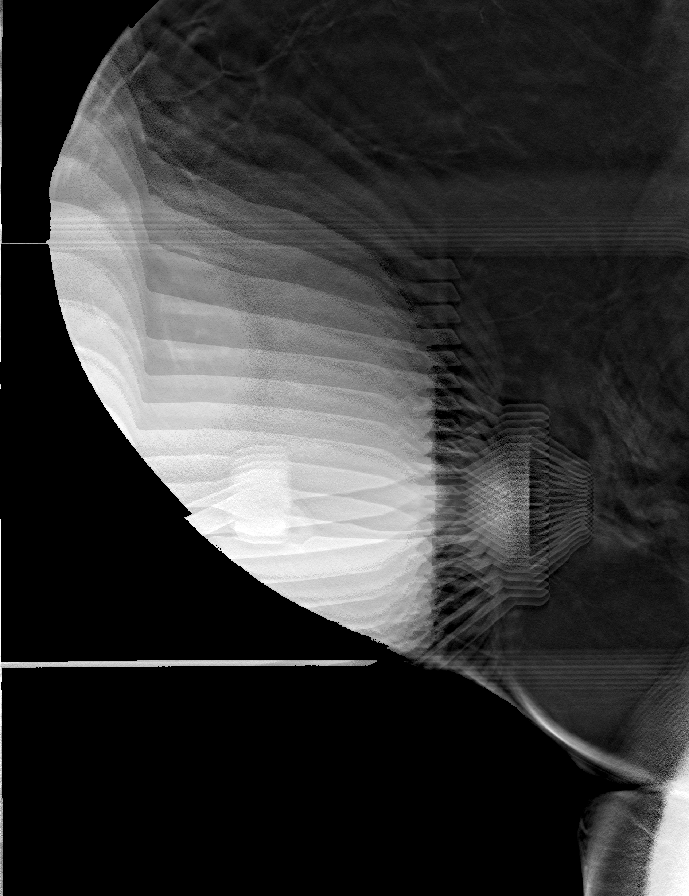

[8 of 17 positions shown; findings below may reference images not displayed]



Using sterile technique and 1% Lidocaine as local anesthetic, under
stereotactic guidance, a 9 gauge vacuum assisted device was used to
perform core needle biopsy of the masslike asymmetry in the
posterior left breast using a superior approach.

Lesion quadrant: Lower outer quadrant, near 9 o'clock.

At the conclusion of the procedure, coil shaped tissue marker clip
was deployed into the biopsy cavity. Follow-up 2-view mammogram was
performed and dictated separately.
IMPRESSION: Stereotactic-guided biopsy of the left breast. No apparent
complications.

ADDENDUM:
Pathology revealed BENIGN ADIPOSE TISSUE AND SKELETAL MUSCLE of the
Left breast, posterior, lateral, 9 o'clock. This was found to be
concordant by Dr. Navin Ennis.

Pathology results were discussed with the patient by telephone. The
patient reported doing well after the biopsy with tenderness at the
site. Post biopsy instructions and care were reviewed and questions
were answered. The patient was encouraged to call The [REDACTED]

The patient was asked to return for Left diagnostic mammography and
possible ultrasound in 6 months and informed a reminder notice would
be sent regarding this appointment.

Pathology results reported by Ervin Solarte, RN on 10/14/2019.



Using sterile technique and 1% Lidocaine as local anesthetic, under
stereotactic guidance, a 9 gauge vacuum assisted device was used to
perform core needle biopsy of the masslike asymmetry in the
posterior left breast using a superior approach.

Lesion quadrant: Lower outer quadrant, near 9 o'clock.

At the conclusion of the procedure, coil shaped tissue marker clip
was deployed into the biopsy cavity. Follow-up 2-view mammogram was
performed and dictated separately.
IMPRESSION: Stereotactic-guided biopsy of the left breast. No apparent
complications.

## 2021-08-14 IMAGING — MG MM BREAST LOCALIZATION CLIP
4 series · 4 of 12 positions shown · non-contrast
Comparison: Previous exam(s).

CLINICAL DATA: Evaluate marker clip placement following
stereotactic core needle biopsy of a left breast asymmetry.

EXAM:
DIAGNOSTIC LEFT MAMMOGRAM POST STEREOTACTIC BIOPSY

[L CC synth-2D]
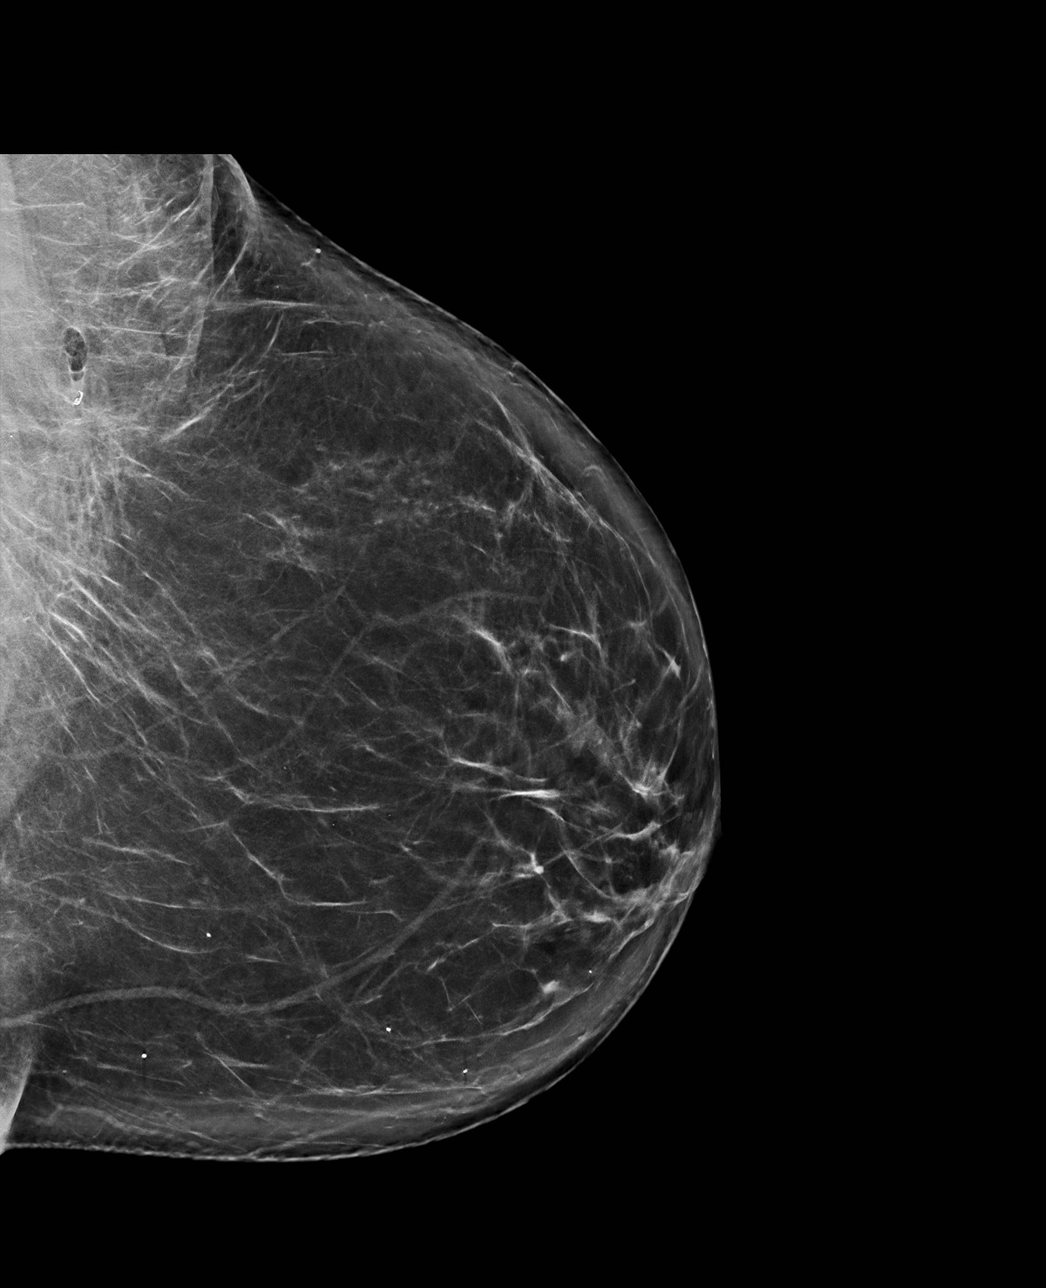

[L ML synth-2D]
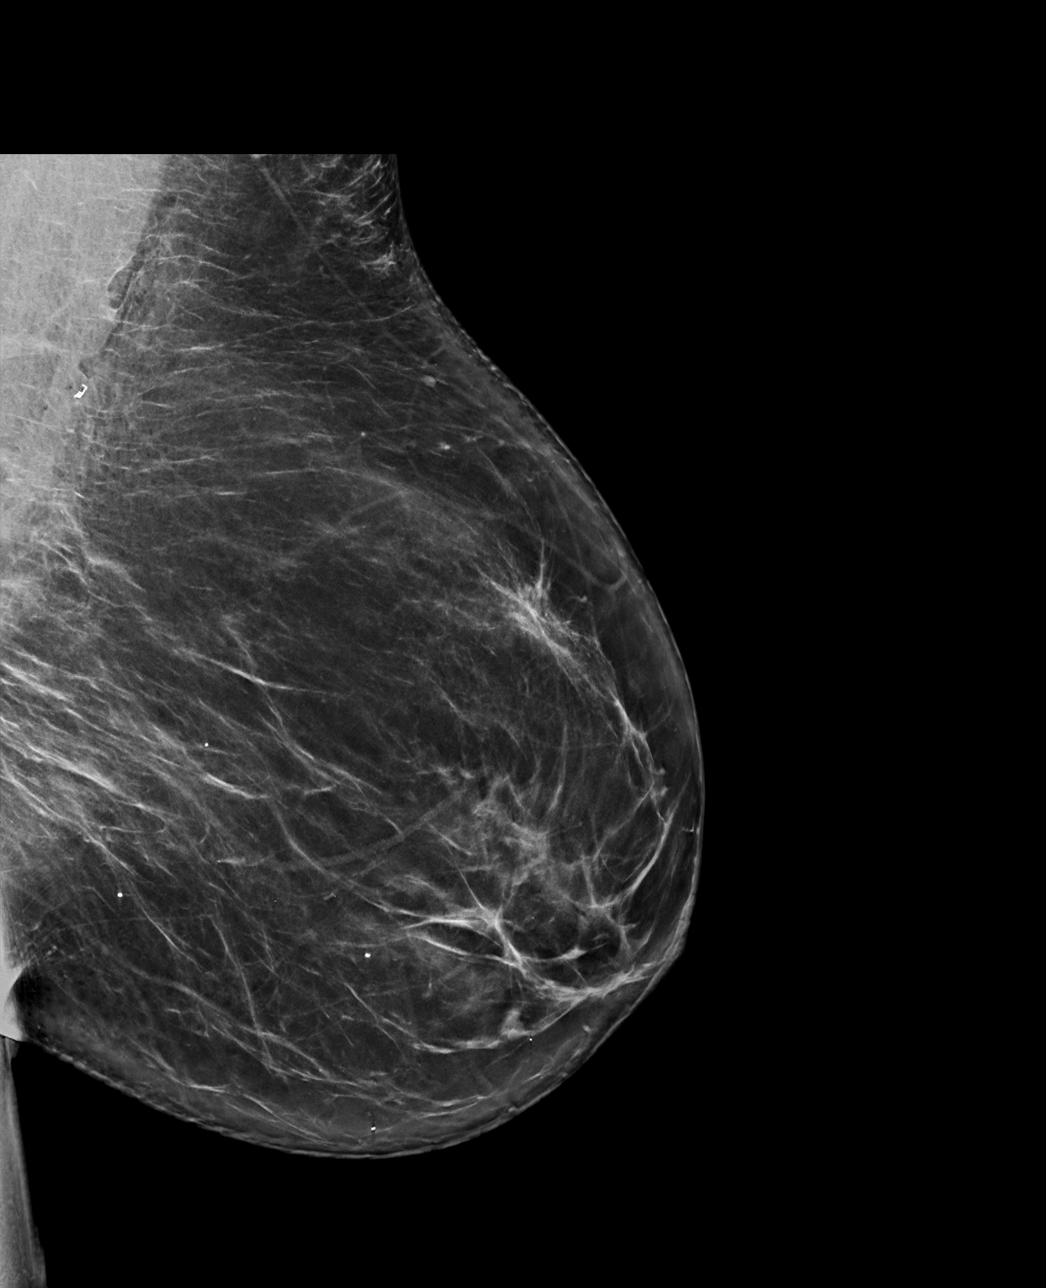

[L ML tomo · tomo slice 45/89.0]
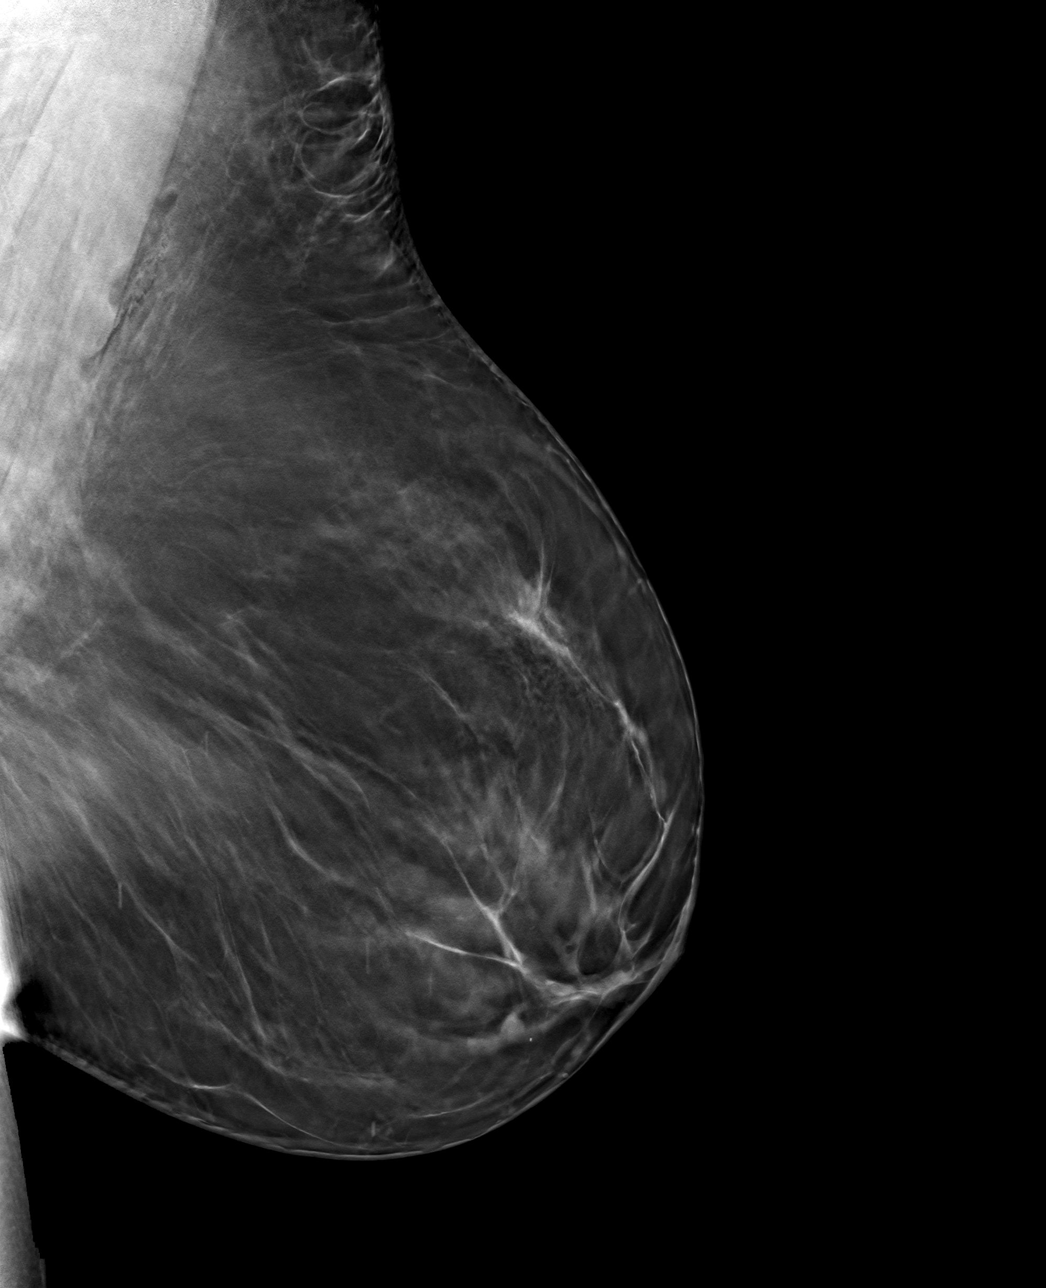

[L CC tomo · tomo slice 43/86.0]
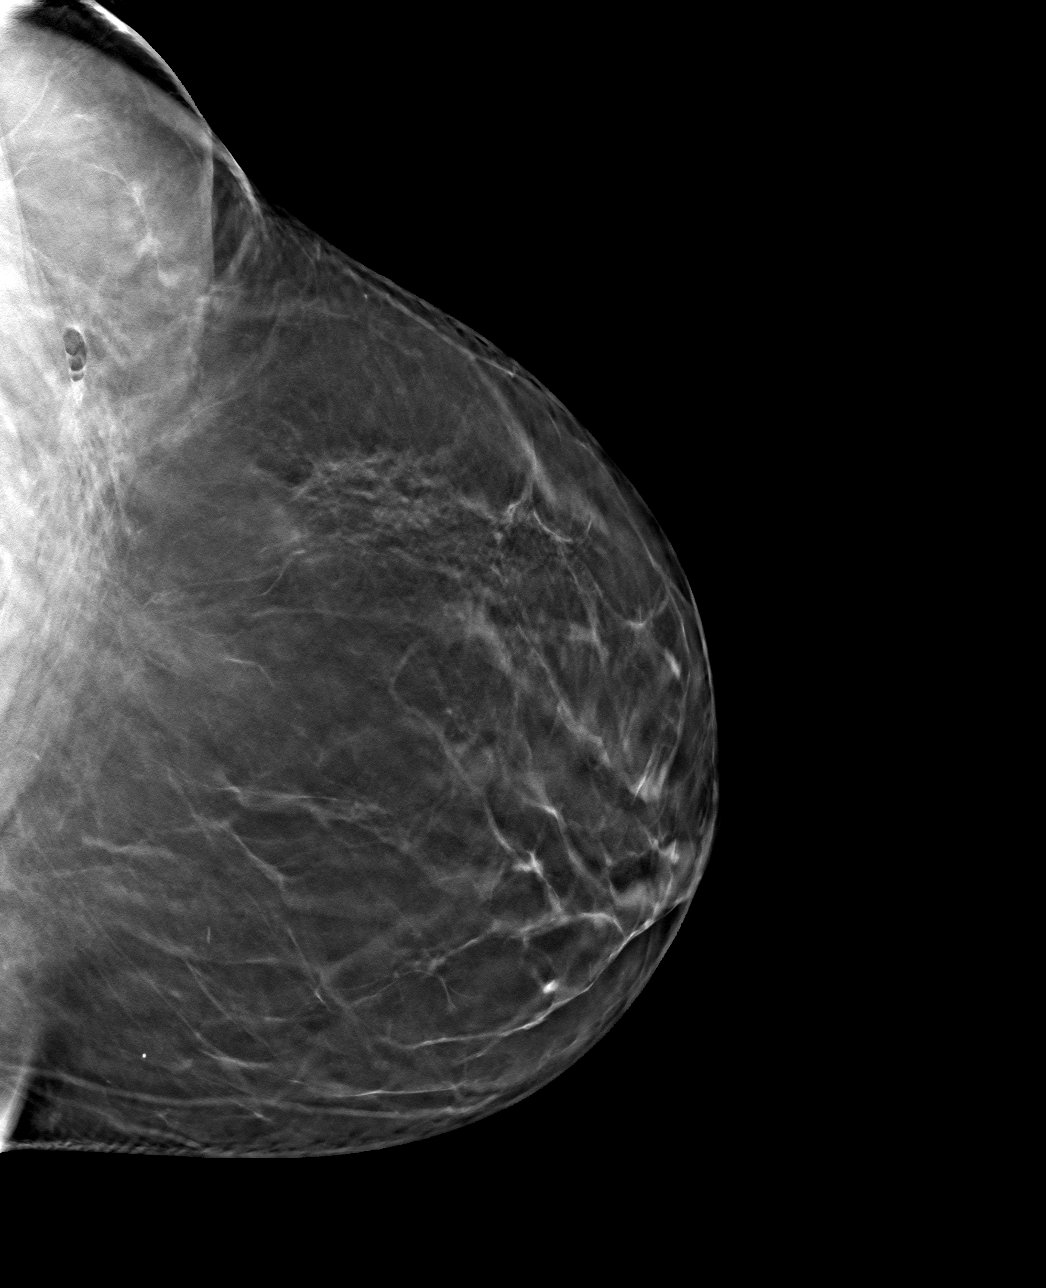

[4 of 12 positions shown; findings below may reference images not displayed]

FINDINGS: Mammographic images were obtained following stereotactic guided
biopsy of a posterior, lateral left breast asymmetry. The biopsy
marking clip is in expected position at the site of biopsy.
IMPRESSION: Appropriate positioning of the coil shaped shaped biopsy marking
clip at the site of biopsy in the posterior, lateral left breast.

Final Assessment: Post Procedure Mammograms for Marker Placement

## 2021-09-18 DIAGNOSIS — F331 Major depressive disorder, recurrent, moderate: Secondary | ICD-10-CM | POA: Diagnosis not present

## 2021-09-18 DIAGNOSIS — F410 Panic disorder [episodic paroxysmal anxiety] without agoraphobia: Secondary | ICD-10-CM | POA: Diagnosis not present

## 2021-09-18 DIAGNOSIS — F411 Generalized anxiety disorder: Secondary | ICD-10-CM | POA: Diagnosis not present

## 2021-09-19 DIAGNOSIS — K219 Gastro-esophageal reflux disease without esophagitis: Secondary | ICD-10-CM | POA: Diagnosis not present

## 2021-09-19 DIAGNOSIS — K52832 Lymphocytic colitis: Secondary | ICD-10-CM | POA: Diagnosis not present

## 2021-10-08 ENCOUNTER — Other Ambulatory Visit: Payer: Self-pay | Admitting: Obstetrics and Gynecology

## 2021-10-08 DIAGNOSIS — Z1231 Encounter for screening mammogram for malignant neoplasm of breast: Secondary | ICD-10-CM

## 2021-10-09 DIAGNOSIS — L433 Subacute (active) lichen planus: Secondary | ICD-10-CM | POA: Diagnosis not present

## 2021-10-09 DIAGNOSIS — D235 Other benign neoplasm of skin of trunk: Secondary | ICD-10-CM | POA: Diagnosis not present

## 2021-10-09 DIAGNOSIS — D225 Melanocytic nevi of trunk: Secondary | ICD-10-CM | POA: Diagnosis not present

## 2021-10-09 DIAGNOSIS — L821 Other seborrheic keratosis: Secondary | ICD-10-CM | POA: Diagnosis not present

## 2021-10-11 DIAGNOSIS — Z01419 Encounter for gynecological examination (general) (routine) without abnormal findings: Secondary | ICD-10-CM | POA: Diagnosis not present

## 2021-10-11 DIAGNOSIS — N926 Irregular menstruation, unspecified: Secondary | ICD-10-CM | POA: Diagnosis not present

## 2021-10-11 DIAGNOSIS — Z6833 Body mass index (BMI) 33.0-33.9, adult: Secondary | ICD-10-CM | POA: Diagnosis not present

## 2021-10-25 DIAGNOSIS — N926 Irregular menstruation, unspecified: Secondary | ICD-10-CM | POA: Diagnosis not present

## 2021-11-11 ENCOUNTER — Ambulatory Visit
Admission: RE | Admit: 2021-11-11 | Discharge: 2021-11-11 | Disposition: A | Discharge status: Home / Self Care | Payer: BC Managed Care – PPO | Source: Ambulatory Visit | Attending: Obstetrics and Gynecology | Admitting: Obstetrics and Gynecology

## 2021-11-11 ENCOUNTER — Other Ambulatory Visit: Payer: Self-pay

## 2021-11-11 DIAGNOSIS — Z1231 Encounter for screening mammogram for malignant neoplasm of breast: Secondary | ICD-10-CM | POA: Diagnosis not present

## 2021-12-16 DIAGNOSIS — F411 Generalized anxiety disorder: Secondary | ICD-10-CM | POA: Diagnosis not present

## 2021-12-16 DIAGNOSIS — F331 Major depressive disorder, recurrent, moderate: Secondary | ICD-10-CM | POA: Diagnosis not present

## 2021-12-16 DIAGNOSIS — F410 Panic disorder [episodic paroxysmal anxiety] without agoraphobia: Secondary | ICD-10-CM | POA: Diagnosis not present

## 2022-01-13 DIAGNOSIS — J40 Bronchitis, not specified as acute or chronic: Secondary | ICD-10-CM | POA: Diagnosis not present

## 2022-01-13 DIAGNOSIS — J069 Acute upper respiratory infection, unspecified: Secondary | ICD-10-CM | POA: Diagnosis not present

## 2022-01-16 DIAGNOSIS — Z03818 Encounter for observation for suspected exposure to other biological agents ruled out: Secondary | ICD-10-CM | POA: Diagnosis not present

## 2022-01-16 DIAGNOSIS — R0981 Nasal congestion: Secondary | ICD-10-CM | POA: Diagnosis not present

## 2022-01-16 DIAGNOSIS — J208 Acute bronchitis due to other specified organisms: Secondary | ICD-10-CM | POA: Diagnosis not present

## 2022-01-16 DIAGNOSIS — R062 Wheezing: Secondary | ICD-10-CM | POA: Diagnosis not present

## 2022-02-07 DIAGNOSIS — M1711 Unilateral primary osteoarthritis, right knee: Secondary | ICD-10-CM | POA: Diagnosis not present

## 2022-02-07 DIAGNOSIS — M25461 Effusion, right knee: Secondary | ICD-10-CM | POA: Diagnosis not present

## 2022-02-12 DIAGNOSIS — F411 Generalized anxiety disorder: Secondary | ICD-10-CM | POA: Diagnosis not present

## 2022-02-12 DIAGNOSIS — F331 Major depressive disorder, recurrent, moderate: Secondary | ICD-10-CM | POA: Diagnosis not present

## 2022-02-12 DIAGNOSIS — F410 Panic disorder [episodic paroxysmal anxiety] without agoraphobia: Secondary | ICD-10-CM | POA: Diagnosis not present

## 2022-02-26 DIAGNOSIS — M1711 Unilateral primary osteoarthritis, right knee: Secondary | ICD-10-CM | POA: Diagnosis not present

## 2022-04-02 DIAGNOSIS — F410 Panic disorder [episodic paroxysmal anxiety] without agoraphobia: Secondary | ICD-10-CM | POA: Diagnosis not present

## 2022-04-02 DIAGNOSIS — F411 Generalized anxiety disorder: Secondary | ICD-10-CM | POA: Diagnosis not present

## 2022-04-02 DIAGNOSIS — F331 Major depressive disorder, recurrent, moderate: Secondary | ICD-10-CM | POA: Diagnosis not present

## 2022-05-30 DIAGNOSIS — F411 Generalized anxiety disorder: Secondary | ICD-10-CM | POA: Diagnosis not present

## 2022-05-30 DIAGNOSIS — F331 Major depressive disorder, recurrent, moderate: Secondary | ICD-10-CM | POA: Diagnosis not present

## 2022-05-30 DIAGNOSIS — F410 Panic disorder [episodic paroxysmal anxiety] without agoraphobia: Secondary | ICD-10-CM | POA: Diagnosis not present

## 2022-08-05 DIAGNOSIS — M25561 Pain in right knee: Secondary | ICD-10-CM | POA: Diagnosis not present

## 2022-08-05 DIAGNOSIS — M1711 Unilateral primary osteoarthritis, right knee: Secondary | ICD-10-CM | POA: Diagnosis not present

## 2022-08-15 IMAGING — US US BREAST*L* LIMITED INC AXILLA
1 series · 4 of 4 positions shown · non-contrast
Comparison: Previous exam(s).

CLINICAL DATA: Palpable lump in the left breast.

EXAM:
DIGITAL DIAGNOSTIC BILATERAL MAMMOGRAM WITH CAD AND TOMO
ULTRASOUND LEFT BREAST

[Series 1: us breast*left* limited inc axilla · 0.06mm/px · 4 of 4 slices shown]
[im 1/4]
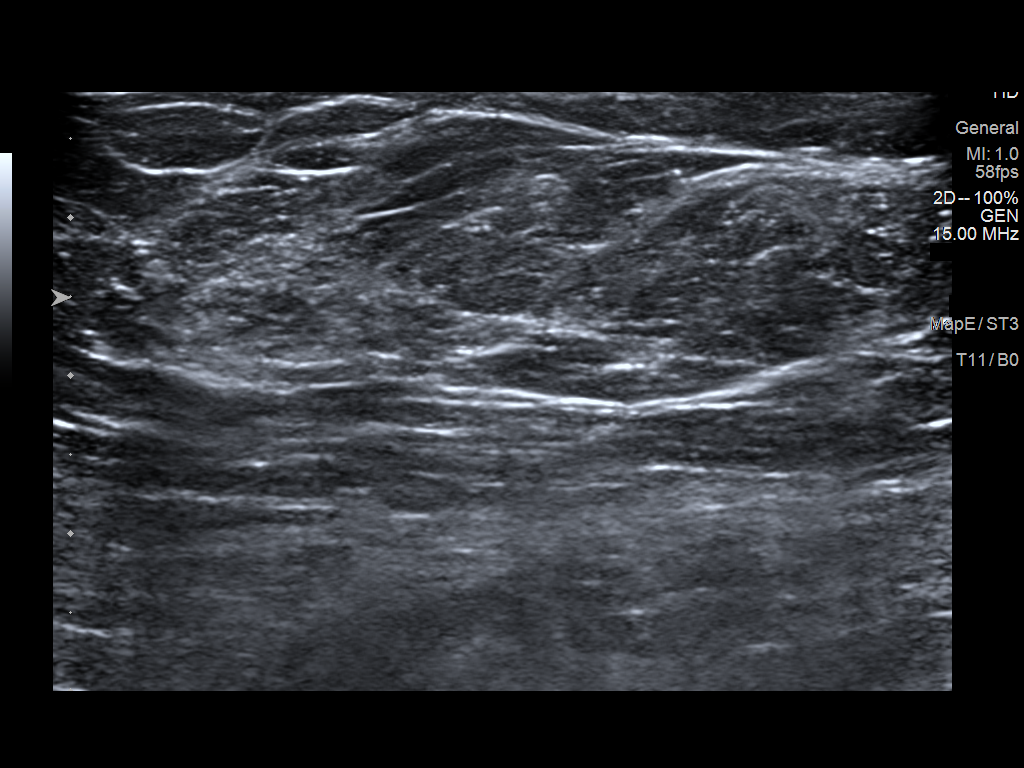
[im 2/4]
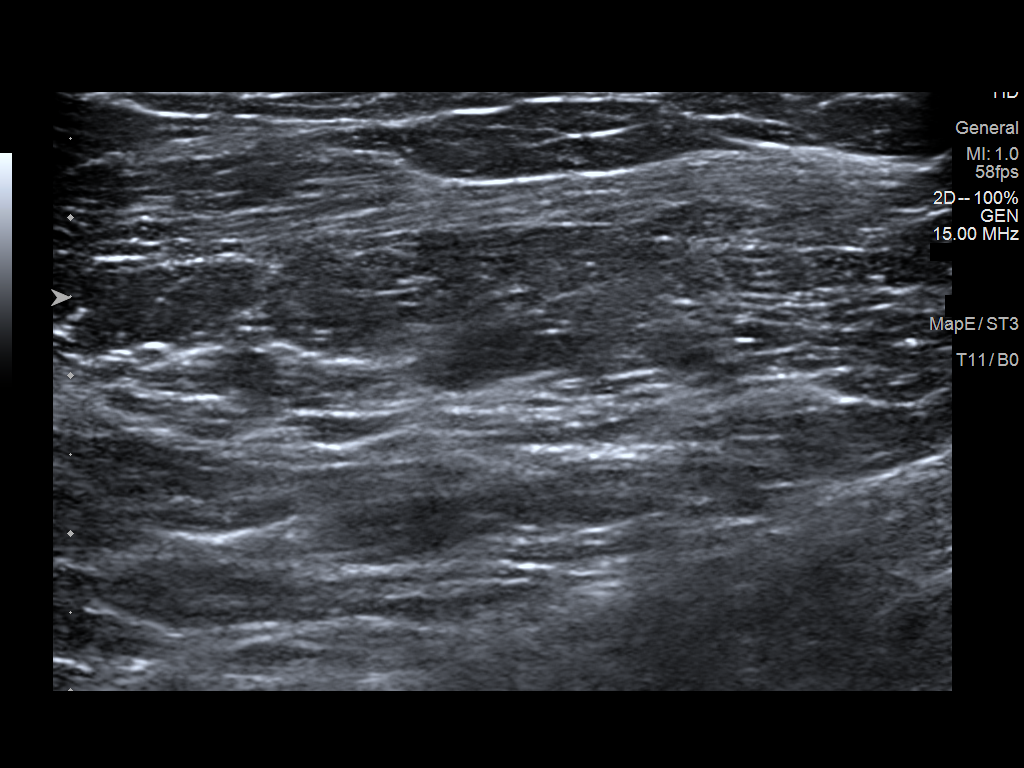
[im 3/4]
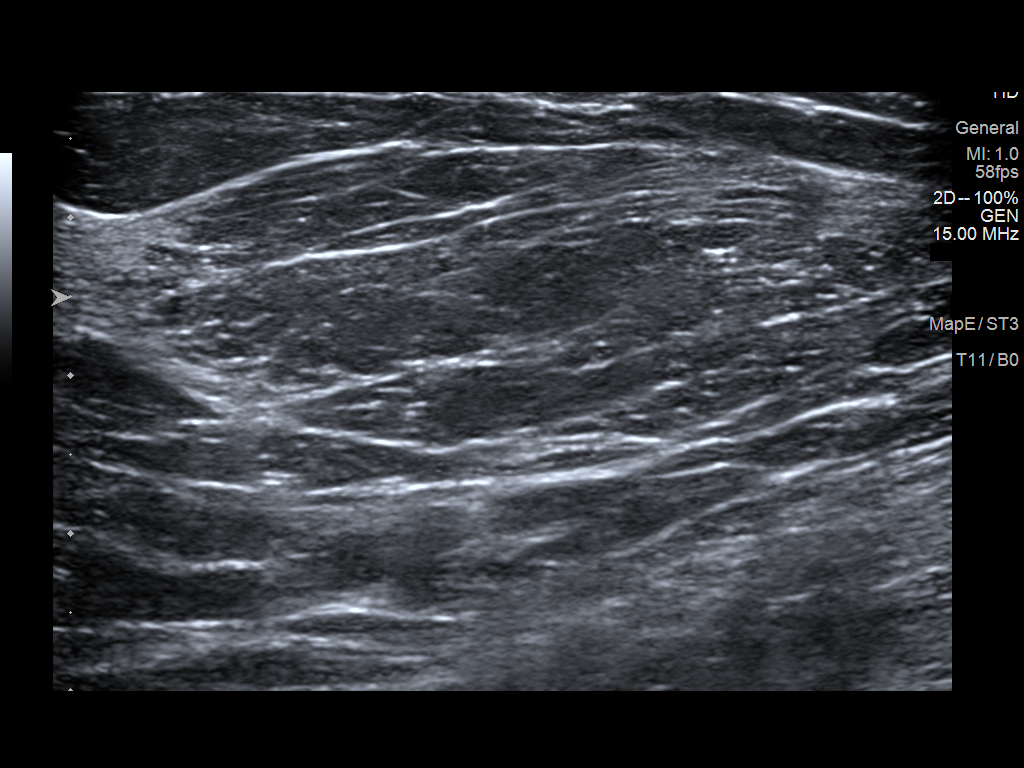
[im 4/4]
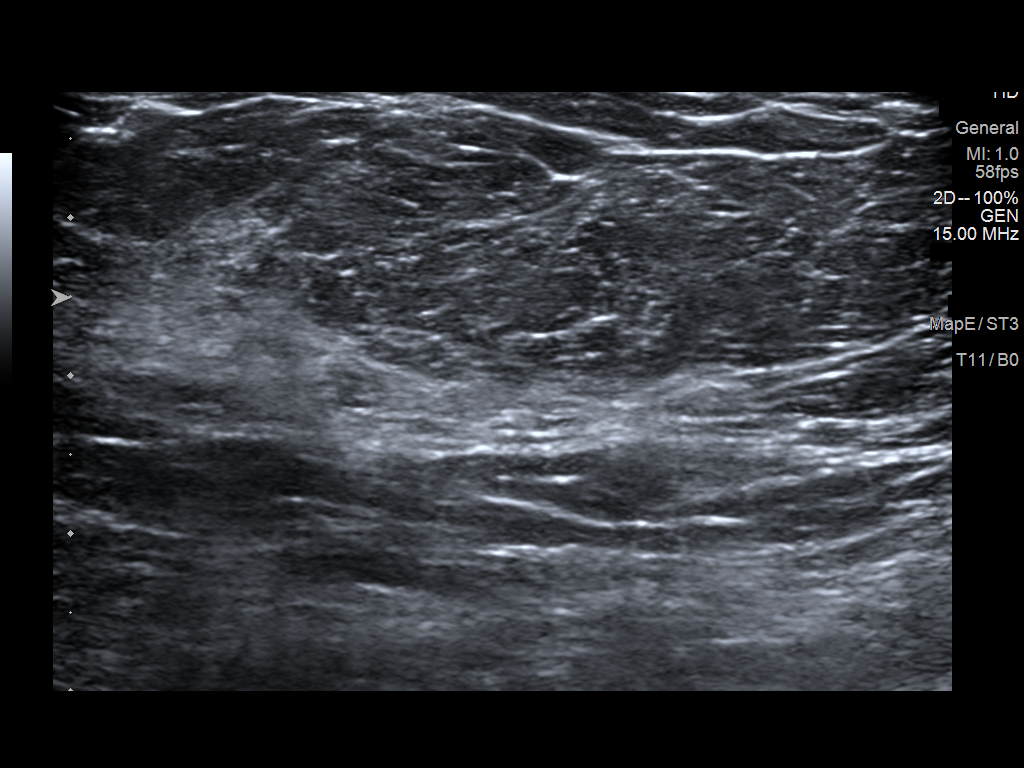

[4 of 4 positions shown; findings below may reference images not displayed]

ACR Breast Density Category b: There are scattered areas of
fibroglandular density.
FINDINGS: No suspicious masses, calcifications, or distortion.

Mammographic images were processed with CAD.

Targeted ultrasound is performed, showing no abnormalities in the
region of the patient's left palpable lump.
IMPRESSION: No mammographic or sonographic evidence of malignancy.

RECOMMENDATION:
Treatment of the patient's symptoms should be based on clinical and
physical exam given lack of imaging findings. Recommend annual
screening mammography.

I have discussed the findings and recommendations with the patient.
If applicable, a reminder letter will be sent to the patient
regarding the next appointment.

BI-RADS CATEGORY  1: Negative.

## 2022-08-27 DIAGNOSIS — F410 Panic disorder [episodic paroxysmal anxiety] without agoraphobia: Secondary | ICD-10-CM | POA: Diagnosis not present

## 2022-08-27 DIAGNOSIS — F411 Generalized anxiety disorder: Secondary | ICD-10-CM | POA: Diagnosis not present

## 2022-08-27 DIAGNOSIS — F331 Major depressive disorder, recurrent, moderate: Secondary | ICD-10-CM | POA: Diagnosis not present

## 2022-10-09 DIAGNOSIS — D225 Melanocytic nevi of trunk: Secondary | ICD-10-CM | POA: Diagnosis not present

## 2022-10-09 DIAGNOSIS — L820 Inflamed seborrheic keratosis: Secondary | ICD-10-CM | POA: Diagnosis not present

## 2022-10-09 DIAGNOSIS — D2262 Melanocytic nevi of left upper limb, including shoulder: Secondary | ICD-10-CM | POA: Diagnosis not present

## 2022-10-09 DIAGNOSIS — L821 Other seborrheic keratosis: Secondary | ICD-10-CM | POA: Diagnosis not present

## 2022-10-09 DIAGNOSIS — D235 Other benign neoplasm of skin of trunk: Secondary | ICD-10-CM | POA: Diagnosis not present

## 2022-10-14 ENCOUNTER — Other Ambulatory Visit: Payer: Self-pay | Admitting: Obstetrics and Gynecology

## 2022-10-14 DIAGNOSIS — Z1231 Encounter for screening mammogram for malignant neoplasm of breast: Secondary | ICD-10-CM

## 2022-10-16 DIAGNOSIS — Z6834 Body mass index (BMI) 34.0-34.9, adult: Secondary | ICD-10-CM | POA: Diagnosis not present

## 2022-10-16 DIAGNOSIS — Z01419 Encounter for gynecological examination (general) (routine) without abnormal findings: Secondary | ICD-10-CM | POA: Diagnosis not present

## 2022-10-16 DIAGNOSIS — Z124 Encounter for screening for malignant neoplasm of cervix: Secondary | ICD-10-CM | POA: Diagnosis not present

## 2022-10-16 DIAGNOSIS — E559 Vitamin D deficiency, unspecified: Secondary | ICD-10-CM | POA: Diagnosis not present

## 2022-10-21 DIAGNOSIS — H1045 Other chronic allergic conjunctivitis: Secondary | ICD-10-CM | POA: Diagnosis not present

## 2022-11-10 DIAGNOSIS — N870 Mild cervical dysplasia: Secondary | ICD-10-CM | POA: Diagnosis not present

## 2022-11-10 DIAGNOSIS — N888 Other specified noninflammatory disorders of cervix uteri: Secondary | ICD-10-CM | POA: Diagnosis not present

## 2022-11-10 DIAGNOSIS — R87610 Atypical squamous cells of undetermined significance on cytologic smear of cervix (ASC-US): Secondary | ICD-10-CM | POA: Diagnosis not present

## 2022-11-19 DIAGNOSIS — F410 Panic disorder [episodic paroxysmal anxiety] without agoraphobia: Secondary | ICD-10-CM | POA: Diagnosis not present

## 2022-11-19 DIAGNOSIS — F411 Generalized anxiety disorder: Secondary | ICD-10-CM | POA: Diagnosis not present

## 2022-11-19 DIAGNOSIS — F331 Major depressive disorder, recurrent, moderate: Secondary | ICD-10-CM | POA: Diagnosis not present

## 2022-12-15 ENCOUNTER — Ambulatory Visit
Admission: RE | Admit: 2022-12-15 | Discharge: 2022-12-15 | Disposition: A | Discharge status: Home / Self Care | Payer: BC Managed Care – PPO | Source: Ambulatory Visit | Attending: Obstetrics and Gynecology | Admitting: Obstetrics and Gynecology

## 2022-12-15 DIAGNOSIS — Z1231 Encounter for screening mammogram for malignant neoplasm of breast: Secondary | ICD-10-CM

## 2022-12-17 ENCOUNTER — Other Ambulatory Visit: Payer: Self-pay | Admitting: Obstetrics and Gynecology

## 2022-12-17 DIAGNOSIS — N644 Mastodynia: Secondary | ICD-10-CM

## 2022-12-17 DIAGNOSIS — N6320 Unspecified lump in the left breast, unspecified quadrant: Secondary | ICD-10-CM

## 2022-12-18 DIAGNOSIS — K52832 Lymphocytic colitis: Secondary | ICD-10-CM | POA: Diagnosis not present

## 2022-12-18 DIAGNOSIS — K219 Gastro-esophageal reflux disease without esophagitis: Secondary | ICD-10-CM | POA: Diagnosis not present

## 2022-12-25 ENCOUNTER — Ambulatory Visit
Admission: RE | Admit: 2022-12-25 | Discharge: 2022-12-25 | Disposition: A | Discharge status: Home / Self Care | Payer: BC Managed Care – PPO | Source: Ambulatory Visit | Attending: Obstetrics and Gynecology | Admitting: Obstetrics and Gynecology

## 2022-12-25 DIAGNOSIS — R928 Other abnormal and inconclusive findings on diagnostic imaging of breast: Secondary | ICD-10-CM | POA: Diagnosis not present

## 2022-12-25 DIAGNOSIS — N644 Mastodynia: Secondary | ICD-10-CM

## 2022-12-25 DIAGNOSIS — N6320 Unspecified lump in the left breast, unspecified quadrant: Secondary | ICD-10-CM

## 2022-12-25 DIAGNOSIS — N6002 Solitary cyst of left breast: Secondary | ICD-10-CM | POA: Diagnosis not present

## 2023-01-01 DIAGNOSIS — K219 Gastro-esophageal reflux disease without esophagitis: Secondary | ICD-10-CM | POA: Diagnosis not present

## 2023-01-01 DIAGNOSIS — E669 Obesity, unspecified: Secondary | ICD-10-CM | POA: Diagnosis not present

## 2023-01-01 DIAGNOSIS — Z6835 Body mass index (BMI) 35.0-35.9, adult: Secondary | ICD-10-CM | POA: Diagnosis not present

## 2023-01-01 DIAGNOSIS — Z0000 Encounter for general adult medical examination without abnormal findings: Secondary | ICD-10-CM | POA: Diagnosis not present

## 2023-01-01 DIAGNOSIS — I10 Essential (primary) hypertension: Secondary | ICD-10-CM | POA: Diagnosis not present

## 2023-01-01 DIAGNOSIS — Z23 Encounter for immunization: Secondary | ICD-10-CM | POA: Diagnosis not present

## 2023-01-01 DIAGNOSIS — K52832 Lymphocytic colitis: Secondary | ICD-10-CM | POA: Diagnosis not present

## 2023-02-09 DIAGNOSIS — F411 Generalized anxiety disorder: Secondary | ICD-10-CM | POA: Diagnosis not present

## 2023-02-09 DIAGNOSIS — F410 Panic disorder [episodic paroxysmal anxiety] without agoraphobia: Secondary | ICD-10-CM | POA: Diagnosis not present

## 2023-02-09 DIAGNOSIS — F331 Major depressive disorder, recurrent, moderate: Secondary | ICD-10-CM | POA: Diagnosis not present

## 2023-02-10 DIAGNOSIS — L438 Other lichen planus: Secondary | ICD-10-CM | POA: Diagnosis not present

## 2023-02-10 DIAGNOSIS — L821 Other seborrheic keratosis: Secondary | ICD-10-CM | POA: Diagnosis not present

## 2023-02-10 DIAGNOSIS — D485 Neoplasm of uncertain behavior of skin: Secondary | ICD-10-CM | POA: Diagnosis not present

## 2023-02-11 DIAGNOSIS — M25521 Pain in right elbow: Secondary | ICD-10-CM | POA: Diagnosis not present

## 2023-03-23 DIAGNOSIS — M25521 Pain in right elbow: Secondary | ICD-10-CM | POA: Diagnosis not present

## 2023-03-30 DIAGNOSIS — M79601 Pain in right arm: Secondary | ICD-10-CM | POA: Diagnosis not present

## 2023-03-30 DIAGNOSIS — M7711 Lateral epicondylitis, right elbow: Secondary | ICD-10-CM | POA: Diagnosis not present

## 2023-04-13 DIAGNOSIS — L304 Erythema intertrigo: Secondary | ICD-10-CM | POA: Diagnosis not present

## 2023-04-13 DIAGNOSIS — L821 Other seborrheic keratosis: Secondary | ICD-10-CM | POA: Diagnosis not present

## 2023-05-06 DIAGNOSIS — Z713 Dietary counseling and surveillance: Secondary | ICD-10-CM | POA: Diagnosis not present

## 2023-05-06 DIAGNOSIS — Z6834 Body mass index (BMI) 34.0-34.9, adult: Secondary | ICD-10-CM | POA: Diagnosis not present

## 2023-05-06 DIAGNOSIS — I10 Essential (primary) hypertension: Secondary | ICD-10-CM | POA: Diagnosis not present

## 2023-05-06 DIAGNOSIS — E669 Obesity, unspecified: Secondary | ICD-10-CM | POA: Diagnosis not present

## 2023-05-11 DIAGNOSIS — R87610 Atypical squamous cells of undetermined significance on cytologic smear of cervix (ASC-US): Secondary | ICD-10-CM | POA: Diagnosis not present

## 2023-05-11 DIAGNOSIS — R87810 Cervical high risk human papillomavirus (HPV) DNA test positive: Secondary | ICD-10-CM | POA: Diagnosis not present

## 2023-05-11 DIAGNOSIS — R87612 Low grade squamous intraepithelial lesion on cytologic smear of cervix (LGSIL): Secondary | ICD-10-CM | POA: Diagnosis not present

## 2023-06-07 DIAGNOSIS — M7989 Other specified soft tissue disorders: Secondary | ICD-10-CM | POA: Diagnosis not present

## 2023-06-07 DIAGNOSIS — T63461A Toxic effect of venom of wasps, accidental (unintentional), initial encounter: Secondary | ICD-10-CM | POA: Diagnosis not present

## 2023-06-07 DIAGNOSIS — L03114 Cellulitis of left upper limb: Secondary | ICD-10-CM | POA: Diagnosis not present

## 2023-07-06 DIAGNOSIS — F411 Generalized anxiety disorder: Secondary | ICD-10-CM | POA: Diagnosis not present

## 2023-07-06 DIAGNOSIS — F331 Major depressive disorder, recurrent, moderate: Secondary | ICD-10-CM | POA: Diagnosis not present

## 2023-07-06 DIAGNOSIS — F410 Panic disorder [episodic paroxysmal anxiety] without agoraphobia: Secondary | ICD-10-CM | POA: Diagnosis not present

## 2023-08-26 DIAGNOSIS — Z6835 Body mass index (BMI) 35.0-35.9, adult: Secondary | ICD-10-CM | POA: Diagnosis not present

## 2023-08-26 DIAGNOSIS — F410 Panic disorder [episodic paroxysmal anxiety] without agoraphobia: Secondary | ICD-10-CM | POA: Diagnosis not present

## 2023-08-26 DIAGNOSIS — F411 Generalized anxiety disorder: Secondary | ICD-10-CM | POA: Diagnosis not present

## 2023-08-26 DIAGNOSIS — F331 Major depressive disorder, recurrent, moderate: Secondary | ICD-10-CM | POA: Diagnosis not present

## 2023-08-26 DIAGNOSIS — E669 Obesity, unspecified: Secondary | ICD-10-CM | POA: Diagnosis not present

## 2023-08-26 DIAGNOSIS — Z23 Encounter for immunization: Secondary | ICD-10-CM | POA: Diagnosis not present

## 2023-08-26 DIAGNOSIS — Z713 Dietary counseling and surveillance: Secondary | ICD-10-CM | POA: Diagnosis not present

## 2023-09-22 ENCOUNTER — Other Ambulatory Visit: Payer: Self-pay | Admitting: Obstetrics and Gynecology

## 2023-09-22 DIAGNOSIS — Z1231 Encounter for screening mammogram for malignant neoplasm of breast: Secondary | ICD-10-CM

## 2023-09-23 DIAGNOSIS — F410 Panic disorder [episodic paroxysmal anxiety] without agoraphobia: Secondary | ICD-10-CM | POA: Diagnosis not present

## 2023-09-23 DIAGNOSIS — F331 Major depressive disorder, recurrent, moderate: Secondary | ICD-10-CM | POA: Diagnosis not present

## 2023-09-23 DIAGNOSIS — F411 Generalized anxiety disorder: Secondary | ICD-10-CM | POA: Diagnosis not present

## 2023-11-03 DIAGNOSIS — D235 Other benign neoplasm of skin of trunk: Secondary | ICD-10-CM | POA: Diagnosis not present

## 2023-11-03 DIAGNOSIS — D2372 Other benign neoplasm of skin of left lower limb, including hip: Secondary | ICD-10-CM | POA: Diagnosis not present

## 2023-11-03 DIAGNOSIS — L814 Other melanin hyperpigmentation: Secondary | ICD-10-CM | POA: Diagnosis not present

## 2023-11-03 DIAGNOSIS — L821 Other seborrheic keratosis: Secondary | ICD-10-CM | POA: Diagnosis not present

## 2023-11-09 DIAGNOSIS — Z124 Encounter for screening for malignant neoplasm of cervix: Secondary | ICD-10-CM | POA: Diagnosis not present

## 2023-11-13 DIAGNOSIS — Z6835 Body mass index (BMI) 35.0-35.9, adult: Secondary | ICD-10-CM | POA: Diagnosis not present

## 2023-11-13 DIAGNOSIS — Z01419 Encounter for gynecological examination (general) (routine) without abnormal findings: Secondary | ICD-10-CM | POA: Diagnosis not present

## 2023-11-19 DIAGNOSIS — D692 Other nonthrombocytopenic purpura: Secondary | ICD-10-CM | POA: Diagnosis not present

## 2023-11-19 DIAGNOSIS — N926 Irregular menstruation, unspecified: Secondary | ICD-10-CM | POA: Diagnosis not present

## 2023-11-20 DIAGNOSIS — F410 Panic disorder [episodic paroxysmal anxiety] without agoraphobia: Secondary | ICD-10-CM | POA: Diagnosis not present

## 2023-11-20 DIAGNOSIS — F331 Major depressive disorder, recurrent, moderate: Secondary | ICD-10-CM | POA: Diagnosis not present

## 2023-11-20 DIAGNOSIS — F411 Generalized anxiety disorder: Secondary | ICD-10-CM | POA: Diagnosis not present

## 2023-12-29 ENCOUNTER — Ambulatory Visit
Admission: RE | Admit: 2023-12-29 | Discharge: 2023-12-29 | Disposition: A | Discharge status: Home / Self Care | Payer: BC Managed Care – PPO | Source: Ambulatory Visit | Attending: Obstetrics and Gynecology | Admitting: Obstetrics and Gynecology

## 2023-12-29 DIAGNOSIS — Z1231 Encounter for screening mammogram for malignant neoplasm of breast: Secondary | ICD-10-CM | POA: Diagnosis not present

## 2024-01-05 DIAGNOSIS — Z23 Encounter for immunization: Secondary | ICD-10-CM | POA: Diagnosis not present

## 2024-01-05 DIAGNOSIS — K219 Gastro-esophageal reflux disease without esophagitis: Secondary | ICD-10-CM | POA: Diagnosis not present

## 2024-01-05 DIAGNOSIS — I10 Essential (primary) hypertension: Secondary | ICD-10-CM | POA: Diagnosis not present

## 2024-01-05 DIAGNOSIS — Z0000 Encounter for general adult medical examination without abnormal findings: Secondary | ICD-10-CM | POA: Diagnosis not present

## 2024-01-05 DIAGNOSIS — Z6835 Body mass index (BMI) 35.0-35.9, adult: Secondary | ICD-10-CM | POA: Diagnosis not present

## 2024-02-11 DIAGNOSIS — F331 Major depressive disorder, recurrent, moderate: Secondary | ICD-10-CM | POA: Diagnosis not present

## 2024-02-11 DIAGNOSIS — F410 Panic disorder [episodic paroxysmal anxiety] without agoraphobia: Secondary | ICD-10-CM | POA: Diagnosis not present

## 2024-02-11 DIAGNOSIS — F411 Generalized anxiety disorder: Secondary | ICD-10-CM | POA: Diagnosis not present

## 2024-04-07 DIAGNOSIS — Z6833 Body mass index (BMI) 33.0-33.9, adult: Secondary | ICD-10-CM | POA: Diagnosis not present

## 2024-04-07 DIAGNOSIS — E669 Obesity, unspecified: Secondary | ICD-10-CM | POA: Diagnosis not present

## 2024-04-07 DIAGNOSIS — K219 Gastro-esophageal reflux disease without esophagitis: Secondary | ICD-10-CM | POA: Diagnosis not present

## 2024-04-07 DIAGNOSIS — I10 Essential (primary) hypertension: Secondary | ICD-10-CM | POA: Diagnosis not present

## 2024-04-07 DIAGNOSIS — Z713 Dietary counseling and surveillance: Secondary | ICD-10-CM | POA: Diagnosis not present

## 2024-05-09 DIAGNOSIS — F411 Generalized anxiety disorder: Secondary | ICD-10-CM | POA: Diagnosis not present

## 2024-05-09 DIAGNOSIS — F410 Panic disorder [episodic paroxysmal anxiety] without agoraphobia: Secondary | ICD-10-CM | POA: Diagnosis not present

## 2024-05-09 DIAGNOSIS — F331 Major depressive disorder, recurrent, moderate: Secondary | ICD-10-CM | POA: Diagnosis not present

## 2024-06-07 DIAGNOSIS — M25461 Effusion, right knee: Secondary | ICD-10-CM | POA: Diagnosis not present

## 2024-06-07 DIAGNOSIS — M1711 Unilateral primary osteoarthritis, right knee: Secondary | ICD-10-CM | POA: Diagnosis not present

## 2024-09-01 DIAGNOSIS — F410 Panic disorder [episodic paroxysmal anxiety] without agoraphobia: Secondary | ICD-10-CM | POA: Diagnosis not present

## 2024-09-01 DIAGNOSIS — F331 Major depressive disorder, recurrent, moderate: Secondary | ICD-10-CM | POA: Diagnosis not present

## 2024-09-01 DIAGNOSIS — F411 Generalized anxiety disorder: Secondary | ICD-10-CM | POA: Diagnosis not present

## 2024-11-02 DIAGNOSIS — D225 Melanocytic nevi of trunk: Secondary | ICD-10-CM | POA: Diagnosis not present

## 2024-11-02 DIAGNOSIS — D235 Other benign neoplasm of skin of trunk: Secondary | ICD-10-CM | POA: Diagnosis not present

## 2024-11-02 DIAGNOSIS — L814 Other melanin hyperpigmentation: Secondary | ICD-10-CM | POA: Diagnosis not present

## 2024-11-02 DIAGNOSIS — L821 Other seborrheic keratosis: Secondary | ICD-10-CM | POA: Diagnosis not present

## 2024-11-18 DIAGNOSIS — F411 Generalized anxiety disorder: Secondary | ICD-10-CM | POA: Diagnosis not present

## 2024-11-18 DIAGNOSIS — F410 Panic disorder [episodic paroxysmal anxiety] without agoraphobia: Secondary | ICD-10-CM | POA: Diagnosis not present

## 2024-11-18 DIAGNOSIS — F331 Major depressive disorder, recurrent, moderate: Secondary | ICD-10-CM | POA: Diagnosis not present

## 2024-12-13 ENCOUNTER — Other Ambulatory Visit: Payer: Self-pay | Admitting: Obstetrics and Gynecology

## 2024-12-13 DIAGNOSIS — Z1231 Encounter for screening mammogram for malignant neoplasm of breast: Secondary | ICD-10-CM

## 2024-12-29 ENCOUNTER — Ambulatory Visit
Admission: RE | Admit: 2024-12-29 | Discharge: 2024-12-29 | Disposition: A | Discharge status: Home / Self Care | Source: Ambulatory Visit | Attending: Obstetrics and Gynecology | Admitting: Obstetrics and Gynecology

## 2024-12-29 DIAGNOSIS — Z1231 Encounter for screening mammogram for malignant neoplasm of breast: Secondary | ICD-10-CM
